# Patient Record
Sex: Male | Born: 1990 | Race: White | Hispanic: No | Marital: Single | Smoking: Former smoker
Health system: Southern US, Community
[De-identification: ages and names within clinical notes are randomized; demographics above are authoritative.]

## PROBLEM LIST (undated history)

## (undated) DIAGNOSIS — J1282 Pneumonia due to coronavirus disease 2019: Secondary | ICD-10-CM

## (undated) DIAGNOSIS — I499 Cardiac arrhythmia, unspecified: Secondary | ICD-10-CM

## (undated) DIAGNOSIS — U071 COVID-19: Secondary | ICD-10-CM

## (undated) DIAGNOSIS — F32A Depression, unspecified: Secondary | ICD-10-CM

## (undated) DIAGNOSIS — F419 Anxiety disorder, unspecified: Secondary | ICD-10-CM

## (undated) HISTORY — PX: KNEE SURGERY: SHX244

---

## 2020-04-10 ENCOUNTER — Other Ambulatory Visit: Payer: Self-pay

## 2020-04-10 ENCOUNTER — Ambulatory Visit (INDEPENDENT_AMBULATORY_CARE_PROVIDER_SITE_OTHER): Payer: No Payment, Other | Admitting: Clinical

## 2020-04-10 DIAGNOSIS — F331 Major depressive disorder, recurrent, moderate: Secondary | ICD-10-CM | POA: Diagnosis not present
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
## 2020-05-03 ENCOUNTER — Encounter (HOSPITAL_COMMUNITY): Payer: Self-pay | Admitting: Psychiatry

## 2020-05-03 ENCOUNTER — Telehealth (INDEPENDENT_AMBULATORY_CARE_PROVIDER_SITE_OTHER): Payer: No Payment, Other | Admitting: Psychiatry

## 2020-05-03 ENCOUNTER — Other Ambulatory Visit: Payer: Self-pay

## 2020-05-03 DIAGNOSIS — F323 Major depressive disorder, single episode, severe with psychotic features: Secondary | ICD-10-CM | POA: Diagnosis not present

## 2020-05-03 DIAGNOSIS — F411 Generalized anxiety disorder: Secondary | ICD-10-CM | POA: Diagnosis not present

## 2020-05-03 DIAGNOSIS — F331 Major depressive disorder, recurrent, moderate: Secondary | ICD-10-CM | POA: Insufficient documentation

## 2020-05-03 MED ORDER — TRAZODONE HCL 50 MG PO TABS: 50.0000 mg | ORAL_TABLET | Freq: Every evening | ORAL | 2 refills | Status: DC | PRN

## 2020-05-03 MED ORDER — DULOXETINE HCL 20 MG PO CPEP: 20.0000 mg | ORAL_CAPSULE | Freq: Every day | ORAL | 2 refills | Status: DC
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
## 2020-05-20 ENCOUNTER — Telehealth (HOSPITAL_COMMUNITY): Payer: Self-pay | Admitting: *Deleted

## 2020-05-20 NOTE — Telephone Encounter (Signed)
Called to express concerns re his new medicine, which is cymbalta and trazodone. States he feels like it is helping him but he has been feeling nauseous all day, and feeling really lethargic and tired. He is taking his medicines with food, and has felt so tired he has called out two days from work. Will share his concerns with Dr Doyne Keel for directions going forward.

## 2020-05-21 NOTE — Telephone Encounter (Signed)
Patient notes that his anxiety and depression has greatly improved since starting Cymbalta and trazodone.  He however notes that he has been feeling more sluggish around 12 noon.  Provider recommended that patient take 25 mg of trazodone instead of 50 mg.  Provider also informed patient that if he does not need the medication he does not have to take it nightly.  He endorsed understanding and agreed.  Provider noted that if patient still feels sluggish after reducing his trazodone dose he can call the clinic to discuss these concerns with provider.  No other concerns noted at this time.

## 2020-05-29 ENCOUNTER — Other Ambulatory Visit: Payer: Self-pay

## 2020-05-29 ENCOUNTER — Telehealth (HOSPITAL_COMMUNITY): Payer: Self-pay | Admitting: Clinical

## 2020-05-29 ENCOUNTER — Ambulatory Visit (INDEPENDENT_AMBULATORY_CARE_PROVIDER_SITE_OTHER): Payer: No Payment, Other | Admitting: Clinical

## 2020-05-29 DIAGNOSIS — F411 Generalized anxiety disorder: Secondary | ICD-10-CM

## 2020-05-30 ENCOUNTER — Emergency Department (HOSPITAL_COMMUNITY)
Admission: EM | Admit: 2020-05-30 | Discharge: 2020-05-30 | Disposition: A | Discharge status: Home / Self Care | Payer: Self-pay | Attending: Emergency Medicine | Admitting: Emergency Medicine

## 2020-05-30 ENCOUNTER — Other Ambulatory Visit: Payer: Self-pay

## 2020-05-30 ENCOUNTER — Emergency Department (HOSPITAL_COMMUNITY): Payer: Self-pay

## 2020-05-30 ENCOUNTER — Encounter (HOSPITAL_COMMUNITY): Payer: Self-pay

## 2020-05-30 DIAGNOSIS — Z20822 Contact with and (suspected) exposure to covid-19: Secondary | ICD-10-CM | POA: Insufficient documentation

## 2020-05-30 DIAGNOSIS — Z87891 Personal history of nicotine dependence: Secondary | ICD-10-CM | POA: Insufficient documentation

## 2020-05-30 DIAGNOSIS — J209 Acute bronchitis, unspecified: Secondary | ICD-10-CM | POA: Insufficient documentation

## 2020-05-30 DIAGNOSIS — Z8616 Personal history of COVID-19: Secondary | ICD-10-CM | POA: Insufficient documentation

## 2020-05-30 HISTORY — DX: Pneumonia due to coronavirus disease 2019: J12.82

## 2020-05-30 HISTORY — DX: Anxiety disorder, unspecified: F41.9

## 2020-05-30 HISTORY — DX: Cardiac arrhythmia, unspecified: I49.9

## 2020-05-30 HISTORY — DX: Depression, unspecified: F32.A

## 2020-05-30 HISTORY — DX: COVID-19: U07.1

## 2020-05-30 MED ORDER — AZITHROMYCIN 250 MG PO TABS: 250.0000 mg | ORAL_TABLET | Freq: Every day | ORAL | 0 refills | Status: DC

## 2020-05-30 MED ORDER — BENZONATATE 100 MG PO CAPS: 100.0000 mg | ORAL_CAPSULE | Freq: Three times a day (TID) | ORAL | 0 refills | Status: DC | PRN

## 2020-05-30 NOTE — ED Triage Notes (Signed)
Patient c/o nasal congestion and cough 1 week .

## 2020-05-30 NOTE — ED Provider Notes (Signed)
[facility]-EMERGENCY DEPT Provider Note   CSN: [id number] Arrival date & time: 05/30/20  1731     History Chief Complaint  Patient presents with  . Cough  . Nasal Congestion    Andrew Turner is a 30 y.o. male.  He is here with a complaint of cough and nasal congestion for 1 week.  He recently traveled to [place].  [place] he is felt feverish but has not checked a temperature.  He had to leave work early today.  He is Covid vaccinated.  Non-smoker.  The history is provided by the patient.  Cough Cough characteristics:  Non-productive Sputum characteristics:  Nondescript Severity:  Moderate Onset quality:  Gradual Timing:  Intermittent Progression:  Unchanged Chronicity:  New Smoker: no   Relieved by:  Nothing Worsened by:  Nothing Ineffective treatments:  None tried Associated symptoms: fever and rhinorrhea   Associated symptoms: no chest pain, no ear pain, no headaches, no myalgias, no rash, no shortness of breath, no sore throat and no wheezing        Past Medical History:  Diagnosis Date  . Anxiety   . Depression   . Irregular heart rhythm   . Pneumonia due to COVID-19 virus     Patient Active Problem List   Diagnosis Date Noted  . Moderate episode of recurrent major depressive disorder (HCC) 05/03/2020  . Severe major depression with psychotic features (HCC) 05/03/2020  . Generalized anxiety disorder 05/03/2020    Past Surgical History:  Procedure Laterality Date  . KNEE SURGERY Left        Family History  Problem Relation Age of Onset  . Atrial fibrillation Mother     Social History   Tobacco Use  . Smoking status: Former Games developer  . Smokeless tobacco: Never Used  Vaping Use  . Vaping Use: Never used  Substance Use Topics  . Alcohol use: Not Currently  . Drug use: Not Currently    Home Medications Prior to Admission medications   Medication Sig Start Date End Date Taking? Authorizing Provider  DULoxetine (CYMBALTA) 20 MG  capsule Take 1 capsule (20 mg total) by mouth daily. 05/03/20   Shanna Cisco, NP  traZODone (DESYREL) 50 MG tablet Take 1 tablet (50 mg total) by mouth at bedtime as needed for sleep. 05/03/20   Shanna Cisco, NP    Allergies    Patient has no known allergies.  Review of Systems   Review of Systems  Constitutional: Positive for fever.  HENT: Positive for rhinorrhea. Negative for ear pain and sore throat.   Eyes: Negative for visual disturbance.  Respiratory: Positive for cough. Negative for shortness of breath and wheezing.   Cardiovascular: Negative for chest pain.  Gastrointestinal: Negative for abdominal pain.  Genitourinary: Negative for dysuria.  Musculoskeletal: Negative for myalgias.  Skin: Negative for rash.  Neurological: Negative for headaches.    Physical Exam Updated Vital Signs BP (!) 144/91 (BP Location: Right Arm)   Pulse 92   Temp 98.3 F (36.8 C) (Oral)   Resp 18   Ht 5\' 8"  (1.727 m)   Wt 80.2 kg   SpO2 98%   BMI 26.88 kg/m   Physical Exam Vitals and nursing note reviewed.  Constitutional:      Appearance: Normal appearance. He is well-developed.  HENT:     Head: Normocephalic and atraumatic.     Right Ear: Tympanic membrane normal.     Left Ear: Tympanic membrane normal.     Mouth/Throat:  Mouth: Mucous membranes are moist.     Pharynx: Oropharynx is clear. No oropharyngeal exudate.  Eyes:     Conjunctiva/sclera: Conjunctivae normal.  Cardiovascular:     Rate and Rhythm: Normal rate and regular rhythm.     Heart sounds: No murmur heard.   Pulmonary:     Effort: Pulmonary effort is normal. No respiratory distress.     Breath sounds: Normal breath sounds.  Abdominal:     Palpations: Abdomen is soft.     Tenderness: There is no abdominal tenderness.  Musculoskeletal:        General: Normal range of motion.     Cervical back: Neck supple.     Right lower leg: No edema.     Left lower leg: No edema.  Skin:    General: Skin is  warm and dry.     Capillary Refill: Capillary refill takes less than 2 seconds.  Neurological:     General: No focal deficit present.     Mental Status: He is alert.     ED Results / Procedures / Treatments   Labs (all labs ordered are listed, but only abnormal results are displayed) Labs Reviewed  SARS CORONAVIRUS 2 (TAT 6-24 HRS)    EKG None  Radiology DG Chest Port 1 View  Result Date: 05/30/2020 CLINICAL DATA:  Cough and congestion EXAM: PORTABLE CHEST 1 VIEW COMPARISON:  None. FINDINGS: The heart size and mediastinal contours are within normal limits. Both lungs are clear. The visualized skeletal structures are unremarkable. IMPRESSION: No active disease. Electronically Signed   By: Jasmine Pang M.D.   On: 05/30/2020 19:13    Procedures Procedures   Medications Ordered in ED Medications - No data to display  ED Course  I have reviewed the triage vital signs and the nursing notes.  Pertinent labs & imaging results that were available during my care of the patient were reviewed by me and considered in my medical decision making (see chart for details).    MDM Rules/Calculators/A&P                         Andrew Turner was evaluated in Emergency Department on 05/31/2020 for the symptoms described in the history of present illness. He was evaluated in the context of the global COVID-19 pandemic, which necessitated consideration that the patient might be at risk for infection with the SARS-CoV-2 virus that causes COVID-19. Institutional protocols and algorithms that pertain to the evaluation of patients at risk for COVID-19 are in a state of rapid change based on information released by regulatory bodies including the CDC and federal and state organizations. These policies and algorithms were followed during the patient's care in the ED.  Differential diagnosis includes COVID, viral URI, bronchitis, pneumonia.  Chest x-ray ordered interpreted by me as no acute findings.  Covid  test pending at time of discharge.  Will cover with Zithromax for possible bacterial bronchitis.  Counseled to stop smoking.  Also counseled on isolation until results of Covid test are back and he can follow this up in MyChart.  Return instructions discussed  Final Clinical Impression(s) / ED Diagnoses Final diagnoses:  Acute bronchitis, unspecified organism  Person under investigation for COVID-19    Rx / DC Orders ED Discharge Orders         Ordered    azithromycin (ZITHROMAX Z-PAK) 250 MG tablet  Daily        05/30/20 1909    benzonatate (TESSALON)  100 MG capsule  3 times daily PRN        05/30/20 1909           Terrilee Files, MD 05/31/20 1026

## 2020-05-30 NOTE — Discharge Instructions (Signed)
You are seen in the emergency department for nasal congestion and cough.  Your Covid testing was pending at time of discharge.  You had a chest x-ray that did not show any pneumonia.  We are treating with antibiotics and cough medicine for bronchitis.  Please drink plenty of fluids.  Return to the emergency department if any worsening or concerning symptoms.  You can follow-up with the results of your Covid test in MyChart.

## 2020-05-31 LAB — SARS CORONAVIRUS 2 (TAT 6-24 HRS): SARS Coronavirus 2: NEGATIVE

## 2020-06-01 NOTE — Progress Notes (Signed)
   THERAPIST PROGRESS NOTE Virtual Visit via Telephone Note  I connected with Andrew Turner on 05/29/20 at  1:00 PM EDT by telephone and verified that I am speaking with the correct person using two identifiers.  Location: Patient: home Provider: office   I discussed the limitations, risks, security and privacy concerns of performing an evaluation and management service by telephone and the availability of in person appointments. I also discussed with the patient that there may be a patient responsible charge related to this service. The patient expressed understanding and agreed to proceed.   Follow Up Instructions: I discussed the assessment and treatment plan with the patient. The patient was provided an opportunity to ask questions and all were answered. The patient agreed with the plan and demonstrated an understanding of the instructions.   The patient was advised to call back or seek an in-person evaluation if the symptoms worsen or if the condition fails to improve as anticipated.   Session Time: 35 minutes  Participation Level: Active  Behavioral Response: CasualAlertEuthymic  Type of Therapy: Individual Therapy  Treatment Goals addressed: Anxiety  Interventions: CBT  Summary:  Andrew Turner is a 30 y.o. male who presents for the scheduled session oriented times five, appropriately dressed, and friendly. Client denied hallucinations and delusions. Client reported on today he has been doing fairly well. Client reported since the last session his anxiety has been making improvement. Client reported he has noticed improved motivation to do his work and hobbies. Client reported he is more aware and working on staying focused on one task at a time. 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 Client reported otherwise he has noticed some changes to his appetite eating a little less than normal but no report of significant weight  changes. Client reported he has also noticed every other morning his hands are shaky before he takes his medication. Client reported he has also noticed that if he doe snot have caffeine he gets headaches.    Suicidal/Homicidal: Nowithout intent/plan  Therapist Response:  Therapist began the session asking how he has been doing since last seen. Therapist actively listened to the clients thoughts and feelings. Therapist used CBT to discuss using go to phrases during work to help manage tasks during work when working with other people. Therapist used CBT to discuss mindful breathing exercises to help alleviate anxiety for homework also. Therapist informed psychiatrist of the clients concern with medication. Client was scheduled for next appointment.    Plan: Return again in 4 weeks for individual therapy.  Diagnosis: Generalized anxiety disorder  Andrew Rhymes Jah Alarid, LCSW 05/29/20

## 2020-06-18 ENCOUNTER — Ambulatory Visit (INDEPENDENT_AMBULATORY_CARE_PROVIDER_SITE_OTHER): Payer: No Payment, Other | Admitting: Clinical

## 2020-06-18 DIAGNOSIS — F411 Generalized anxiety disorder: Secondary | ICD-10-CM

## 2020-06-18 NOTE — Progress Notes (Signed)
   THERAPIST PROGRESS NOTE Virtual Visit via Video Note  I connected with Andrew Turner on 06/18/20 at  1:00 PM EDT by a video enabled telemedicine application and verified that I am speaking with the correct person using two identifiers.  Location: Patient: home Provider: office   I discussed the limitations of evaluation and management by telemedicine and the availability of in person appointments. The patient expressed understanding and agreed to proceed.   Follow Up Instructions: I discussed the assessment and treatment plan with the patient. The patient was provided an opportunity to ask questions and all were answered. The patient agreed with the plan and demonstrated an understanding of the instructions.   The patient was advised to call back or seek an in-person evaluation if the symptoms worsen or if the condition fails to improve as anticipated.   Session Time: 40 minutes  Participation Level: Active  Behavioral Response: CasualAlertEuthymic  Type of Therapy: Individual Therapy  Treatment Goals addressed: Anxiety  Interventions: CBT  Summary:  Andrew Turner is a 30 y.o. male who presents for the scheduled session oriented times five, appropriately dressed, and friendly. Client denied hallucinations and delusions. Client reported on today he is feeling better. Client reported since the last session he found out he had bronchitis and is now recovered from it. Client reported he did encounter a challenge at work with one of his managers. 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 Client reported he was able to have the issue resolved pretty quickly. Client reported he can tell an improvement with how he handled the situation because his anger did not escalate the situation. Client reported he has noticed that his sleep is becoming erratic an his appetite is decreasing. Client reported he is not sure what has caused  either one to occur. 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 Client reported he is nervous about it because of his previous knee surgery. Client reported otherwise he is doing well with medication and able to better manage his response to stressors and communicate how he feels in a constructive way.    Suicidal/Homicidal: Nowithout intent/plan  Therapist Response:  Therapist began the session asking how he has been since the last session. Therapist used active listening and positive emotional support while he discussed his thoughts and feelings. Therapist used CBT and engaged with the client to discuss engaging in positive self talk and communication skills to address his needs. Client was scheduled for next appointment.     Plan: Return again in 5 weeks.  Diagnosis: Generalized anxiety disorder   Neena Rhymes Henreitta Spittler, LCSW 06/18/2020

## 2020-07-06 ENCOUNTER — Emergency Department (HOSPITAL_COMMUNITY): Payer: Self-pay

## 2020-07-06 ENCOUNTER — Other Ambulatory Visit: Payer: Self-pay

## 2020-07-06 ENCOUNTER — Emergency Department (HOSPITAL_COMMUNITY)
Admission: EM | Admit: 2020-07-06 | Discharge: 2020-07-06 | Disposition: A | Discharge status: Home / Self Care | Payer: Self-pay | Attending: Emergency Medicine | Admitting: Emergency Medicine

## 2020-07-06 ENCOUNTER — Encounter (HOSPITAL_COMMUNITY): Payer: Self-pay | Admitting: Emergency Medicine

## 2020-07-06 DIAGNOSIS — M25562 Pain in left knee: Secondary | ICD-10-CM | POA: Insufficient documentation

## 2020-07-06 DIAGNOSIS — X501XXA Overexertion from prolonged static or awkward postures, initial encounter: Secondary | ICD-10-CM | POA: Insufficient documentation

## 2020-07-06 DIAGNOSIS — Z8616 Personal history of COVID-19: Secondary | ICD-10-CM | POA: Insufficient documentation

## 2020-07-06 DIAGNOSIS — Z87891 Personal history of nicotine dependence: Secondary | ICD-10-CM | POA: Insufficient documentation

## 2020-07-06 DIAGNOSIS — Y9372 Activity, wrestling: Secondary | ICD-10-CM | POA: Insufficient documentation

## 2020-07-06 NOTE — ED Notes (Signed)
Knee immbolizer placed on patient before discharge.

## 2020-07-06 NOTE — Discharge Instructions (Signed)
Recommend to take it easy for the next 5-7 days.  Then can slowly resume normal activity. Can take anti-inflammatories (motrin, aleve, etc). If not improving, recommend follow-up with Dr. Susa Simmonds.  Can call his office to arrange an appointment.

## 2020-07-06 NOTE — ED Provider Notes (Signed)
[facility]-EMERGENCY DEPT Provider Note   CSN: [id number] Arrival date & time: 07/06/20  2143     History Chief Complaint  Patient presents with  . Knee Pain    Andrew Turner is a 30 y.o. male.  The history is provided by the patient and medical records.    30 y.o. M with history of anxiety, depression, presenting to the ED with left knee injury.  He reports he is currently undergoing "pro-wrestling" training and strained his knee on Thursday while doing a knee jab.  He states his knee "gave out" afterwards.  He was ok Friday and most of the day today but then started having pain again.  He has been ambulatory.  He reports prior left knee surgery years ago in [place].  He states he just wanted to make sure he was ok resuming his training.  Past Medical History:  Diagnosis Date  . Anxiety   . Depression   . Irregular heart rhythm   . Pneumonia due to COVID-19 virus     Patient Active Problem List   Diagnosis Date Noted  . Moderate episode of recurrent major depressive disorder (HCC) 05/03/2020  . Severe major depression with psychotic features (HCC) 05/03/2020  . Generalized anxiety disorder 05/03/2020    Past Surgical History:  Procedure Laterality Date  . KNEE SURGERY Left        Family History  Problem Relation Age of Onset  . Atrial fibrillation Mother     Social History   Tobacco Use  . Smoking status: Former Games developer  . Smokeless tobacco: Never Used  Vaping Use  . Vaping Use: Never used  Substance Use Topics  . Alcohol use: Not Currently  . Drug use: Not Currently    Home Medications Prior to Admission medications   Medication Sig Start Date End Date Taking? Authorizing Provider  DULoxetine (CYMBALTA) 20 MG capsule Take 1 capsule (20 mg total) by mouth daily. 05/03/20  Yes Toy Cookey E, NP  traZODone (DESYREL) 50 MG tablet Take 1 tablet (50 mg total) by mouth at bedtime as needed for sleep. 05/03/20  Yes Toy Cookey E, NP   azithromycin (ZITHROMAX Z-PAK) 250 MG tablet Take 1 tablet (250 mg total) by mouth daily. Take 2 tablets first day Patient not taking: Reported on 07/06/2020 05/30/20   Terrilee Files, MD  benzonatate (TESSALON) 100 MG capsule Take 1 capsule (100 mg total) by mouth 3 (three) times daily as needed for cough. Patient not taking: Reported on 07/06/2020 05/30/20   Terrilee Files, MD    Allergies    Patient has no known allergies.  Review of Systems   Review of Systems  Musculoskeletal: Positive for arthralgias.  All other systems reviewed and are negative.   Physical Exam Updated Vital Signs BP (!) 144/94 (BP Location: Right Arm)   Pulse 86   Temp 98.3 F (36.8 C) (Oral)   Resp 18   Ht 5\' 8"  (1.727 m)   Wt 78 kg   SpO2 98%   BMI 26.15 kg/m   Physical Exam Vitals and nursing note reviewed.  Constitutional:      Appearance: He is well-developed.  HENT:     Head: Normocephalic and atraumatic.  Eyes:     Conjunctiva/sclera: Conjunctivae normal.     Pupils: Pupils are equal, round, and reactive to light.  Cardiovascular:     Rate and Rhythm: Normal rate and regular rhythm.     Heart sounds: Normal heart sounds.  Pulmonary:  Effort: Pulmonary effort is normal.     Breath sounds: Normal breath sounds.  Abdominal:     General: Bowel sounds are normal.     Palpations: Abdomen is soft.  Musculoskeletal:        General: Normal range of motion.     Cervical back: Normal range of motion.       Legs:     Comments: Left knee grossly normal in appearance, there is some mild tenderness along lateral aspect of patellar tendon but this remains intact, is able to flex/extend without elicited pain  Skin:    General: Skin is warm and dry.  Neurological:     Mental Status: He is alert and oriented to person, place, and time.     ED Results / Procedures / Treatments   Labs (all labs ordered are listed, but only abnormal results are displayed) Labs Reviewed - No data to  display  EKG None  Radiology DG Knee Complete 4 Views Left  Result Date: 07/06/2020 CLINICAL DATA:  Knee pain following training EXAM: LEFT KNEE - COMPLETE 4+ VIEW COMPARISON:  None. FINDINGS: Postsurgical changes are noted. Mild tricompartmental degenerative changes are seen. No joint effusion is seen. No soft tissue abnormality is noted. No acute fracture is seen. IMPRESSION: Chronic changes without acute abnormality. Electronically Signed   By: Alcide Clever M.D.   On: 07/06/2020 22:25    Procedures Procedures   Medications Ordered in ED Medications - No data to display  ED Course  I have reviewed the triage vital signs and the nursing notes.  Pertinent labs & imaging results that were available during my care of the patient were reviewed by me and considered in my medical decision making (see chart for details).    MDM Rules/Calculators/A&P  30 y.o. M here with left knee pain.  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  He remains ambulatory without difficulty.  Has had prior left knee surgery in the past.  On exam, he has some mild tenderness just lateral to the patellar tendon but this remains intact.  He is able to flex/extend without issue.  X-ray is negative.  Suspect this is more of a strain type injury from overuse as he recently started training 3x a week for several hours at a time.  Will place in knee sleeve, recommended supportive care for now.  Close follow-up with orthopedics given if not improving.  Return here for any new/acute changes.  Final Clinical Impression(s) / ED Diagnoses Final diagnoses:  Acute pain of left knee    Rx / DC Orders ED Discharge Orders    None       Garlon Hatchet, PA-C 07/06/20 2256    Cheryll Cockayne, MD 07/06/20 2303

## 2020-07-06 NOTE — ED Triage Notes (Signed)
Patient arrives complaining of left knee pain after his knee buckled during a training session. Patient ambulatory with steady gait. No swelling noted.

## 2020-07-30 ENCOUNTER — Other Ambulatory Visit (HOSPITAL_COMMUNITY): Payer: Self-pay | Admitting: Psychiatry

## 2020-07-30 ENCOUNTER — Other Ambulatory Visit: Payer: Self-pay

## 2020-07-30 ENCOUNTER — Encounter (HOSPITAL_COMMUNITY): Payer: Self-pay | Admitting: Psychiatry

## 2020-07-30 ENCOUNTER — Telehealth (INDEPENDENT_AMBULATORY_CARE_PROVIDER_SITE_OTHER): Payer: No Payment, Other | Admitting: Psychiatry

## 2020-07-30 DIAGNOSIS — F411 Generalized anxiety disorder: Secondary | ICD-10-CM | POA: Diagnosis not present

## 2020-07-30 DIAGNOSIS — F323 Major depressive disorder, single episode, severe with psychotic features: Secondary | ICD-10-CM | POA: Diagnosis not present

## 2020-07-30 MED ORDER — TRAZODONE HCL 50 MG PO TABS: 50.0000 mg | ORAL_TABLET | Freq: Every evening | ORAL | 2 refills | Status: DC | PRN

## 2020-07-30 MED ORDER — DULOXETINE HCL 20 MG PO CPEP: ORAL_CAPSULE | ORAL | 2 refills | Status: DC

## 2020-07-30 NOTE — Progress Notes (Signed)
BH MD/PA/NP OP Progress Note Virtual Visit via Video Note  I connected with Andrew Turner on 07/30/20 at  2:00 PM EDT by a video enabled telemedicine application and verified that I am speaking with the correct person using two identifiers.  Location: Patient: Home Provider: Clinic   I discussed the limitations of evaluation and management by telemedicine and the availability of in person appointments. The patient expressed understanding and agreed to proceed.  I provided of non-face-to-face time during this encounter.    07/30/2020 2:16 PM Andrew Turner  MRN:  [id number]  Chief Complaint: " I was nauseous so I stopped the trazodone but I am going to restart it"  HPI: 30 year old male seen today for follow up psychiatric evaluation. He has a psychiatric history of poly substance use (marjuina, alcohol, molly, acid, prescription pills), anxiety and depression. Currently he is managed on Celexa 20 mg daily and trazodone 25 mg - 50 mg nightly as needed.  He notes his medications are effective in managing his psychiatric conditions.  Today he is pleasant, cooperative, and engaged in conversation. He informed provider that since his last visit his anxiety and depression has improved.  Provider conducted a GAD-7 and patient scored a 3, at his last visit he scored a 18.  Provider also conducted a PHQ-9 and patient scored an 8, at his last visit he scored a 25.  Patient notes that he discontinued trazodone because he was nauseous.  He informed Clinical research associate however that since discontinuing trazodone he has not been able to sleep.  He informed Clinical research associate that he plans to restart it because he will be working more at [employer] and wants to be rested.  Today he denies SI/HI/VAH, mania, or paranoia.    Patient notes that he continues to maintain his sobriety.  He informed provider that he was at wrestling practice recently and hurt his left knee.  He describes his pain as 7 out of 10.  Patient notes that  he has been taking ibuprofen and Advil to help manage his pain.  No medication changes made today.  Patient agreeable to continue medication scribed.  He will follow-up with outpatient counseling for therapy.  No other concerns noted at this time.   Visit Diagnosis:    ICD-10-CM   1. Severe major depression with psychotic features (HCC)  F32.3 traZODone (DESYREL) 50 MG tablet    DULoxetine (CYMBALTA) 20 MG capsule  2. Generalized anxiety disorder  F41.1 DULoxetine (CYMBALTA) 20 MG capsule    Past Psychiatric History: poly substance use (marjuina, alcohol, molly, acid, prescription pills), anxiety and depression  Past Medical History:  Past Medical History:  Diagnosis Date  . Anxiety   . Depression   . Irregular heart rhythm   . Pneumonia due to COVID-19 virus     Past Surgical History:  Procedure Laterality Date  . KNEE SURGERY Left     Family Psychiatric History:  Father alcohol use  Family History:  Family History  Problem Relation Age of Onset  . Atrial fibrillation Mother     Social History:  Social History   Socioeconomic History  . Marital status: Single    Spouse name: Not on file  . Number of children: Not on file  . Years of education: Not on file  . Highest education level: Not on file  Occupational History  . Not on file  Tobacco Use  . Smoking status: Former Games developer  . Smokeless tobacco: Never Used  Vaping Use  .  Vaping Use: Never used  Substance and Sexual Activity  . Alcohol use: Not Currently  . Drug use: Not Currently  . Sexual activity: Not on file  Other Topics Concern  . Not on file  Social History Narrative  . Not on file   Social Determinants of Health   Financial Resource Strain: Not on file  Food Insecurity: Not on file  Transportation Needs: Not on file  Physical Activity: Not on file  Stress: Not on file  Social Connections: Not on file    Allergies: No Known Allergies  Metabolic Disorder Labs: No results found for:  HGBA1C, MPG No results found for: PROLACTIN No results found for: CHOL, TRIG, HDL, CHOLHDL, VLDL, LDLCALC No results found for: TSH  Therapeutic Level Labs: No results found for: LITHIUM No results found for: VALPROATE No components found for:  CBMZ  Current Medications: Current Outpatient Medications  Medication Sig Dispense Refill  . azithromycin (ZITHROMAX Z-PAK) 250 MG tablet Take 1 tablet (250 mg total) by mouth daily. Take 2 tablets first day (Patient not taking: Reported on 07/06/2020) 6 tablet 0  . benzonatate (TESSALON) 100 MG capsule Take 1 capsule (100 mg total) by mouth 3 (three) times daily as needed for cough. (Patient not taking: Reported on 07/06/2020) 21 capsule 0  . DULoxetine (CYMBALTA) 20 MG capsule TAKE 1 CAPSULE BY MOUTH EVERY DAY 30 capsule 2  . traZODone (DESYREL) 50 MG tablet Take 1 tablet (50 mg total) by mouth at bedtime as needed. for sleep 30 tablet 2   No current facility-administered medications for this visit.     Musculoskeletal: Strength & Muscle Tone: Unable to assess due to telehealth visit Gait & Station: Unable to assess due to telehealth visit Patient leans: N/A  Psychiatric Specialty Exam: Review of Systems  There were no vitals taken for this visit.There is no height or weight on file to calculate BMI.  General Appearance: Well Groomed  Eye Contact:  Good  Speech:  Clear and Coherent and Normal Rate  Volume:  Normal  Mood:  Euthymic  Affect:  Appropriate and Congruent  Thought Process:  Coherent, Goal Directed and Linear  Orientation:  Full (Time, Place, and Person)  Thought Content: WDL and Logical   Suicidal Thoughts:  No  Homicidal Thoughts:  No  Memory:  Immediate;   Good Recent;   Good Remote;   Good  Judgement:  Good  Insight:  Good  Psychomotor Activity:  Normal  Concentration:  Concentration: Good and Attention Span: Good  Recall:  Good  Fund of Knowledge: Good  Language: Good  Akathisia:  No  Handed:  Right  AIMS (if  indicated): Not done  Assets:  Communication Skills Desire for Improvement Financial Resources/Insurance Housing Leisure Time Physical Health Social Support  ADL's:  Intact  Cognition: WNL  Sleep:  Fair   Screenings: GAD-7   Flowsheet Row Video Visit from 07/30/2020 in [facility] Video Visit from 05/03/2020 in [facility] Counselor from 04/10/2020 in [facility]  Total GAD-7 Score 3 18 15     PHQ2-9   Flowsheet Row Video Visit from 07/30/2020 in [facility] Video Visit from 05/03/2020 in [facility] Counselor from 04/10/2020 in [facility]  PHQ-2 Total Score 1 4 4   PHQ-9 Total Score 8 25 18     Flowsheet Row ED from 07/06/2020 in [facility] ED from 05/30/2020 in [facility] Video Visit from 05/03/2020 in [facility]  C-SSRS RISK CATEGORY No Risk No Risk Error: Q7 should not be populated when Q6 is No       Assessment and Plan: Patient notes that his anxiety and depression has improved since his last visit.  He however notes that his sleep has worsened due to discontinuing trazodone because it made him nauseous.  Today he reports that he wants to restart trazodone.  He will continue all other medication as prescribed.  1. Severe major depression with psychotic features (HCC)  Restart- traZODone (DESYREL) 50 MG tablet; Take 1 tablet (50 mg total) by mouth at bedtime as needed. for sleep  Dispense: 30 tablet; Refill: 2 Continue- DULoxetine (CYMBALTA) 20 MG capsule; TAKE 1 CAPSULE BY MOUTH EVERY DAY  Dispense: 30 capsule; Refill: 2  2. Generalized anxiety disorder * -Continue DULoxetine (CYMBALTA) 20 MG capsule; TAKE 1 CAPSULE BY MOUTH EVERY DAY  Dispense: 30 capsule; Refill: 2  Follow-up in 3 months Follow-up with  therapy  Shanna Cisco, NP 07/30/2020, 2:16 PM

## 2020-08-06 ENCOUNTER — Ambulatory Visit (INDEPENDENT_AMBULATORY_CARE_PROVIDER_SITE_OTHER): Payer: No Payment, Other | Admitting: Clinical

## 2020-08-06 ENCOUNTER — Other Ambulatory Visit: Payer: Self-pay

## 2020-08-06 DIAGNOSIS — F411 Generalized anxiety disorder: Secondary | ICD-10-CM

## 2020-08-06 NOTE — Progress Notes (Signed)
   THERAPIST PROGRESS NOTE Virtual Visit via Video Note  I connected with [name] on 08/06/20 at  1:00 PM EDT by a video enabled telemedicine application and verified that I am speaking with the correct person using two identifiers.  Location: Patient: home Provider: office   I discussed the limitations of evaluation and management by telemedicine and the availability of in person appointments. The patient expressed understanding and agreed to proceed.   Follow Up Instructions: I discussed the assessment and treatment plan with the patient. The patient was provided an opportunity to ask questions and all were answered. The patient agreed with the plan and demonstrated an understanding of the instructions.   The patient was advised to call back or seek an in-person evaluation if the symptoms worsen or if the condition fails to improve as anticipated.   Session Time: 30 minutes  Participation Level: Active  Behavioral Response: CasualAlertEuthymic  Type of Therapy: Individual Therapy  Treatment Goals addressed: Anxiety  Interventions: CBT and Supportive  Summary:  Andrew Turner is a 30 y.o. male who presents for the scheduled session oriented times five, appropriately dressed, and friendly. Client denied hallucinations and delusions.  Client reported on today he is doing well. Client reported since the last session he quit his job due to conflict but was able to go back to work a few weeks ago. Client reported he pursued wrestling but he reinjured his knee. Client reported he went to his doctor and is now saving money to be able to see a orthopedic surgeon. Client reported his knee hurts daily now. Client reported otherwise he is doing well with his medications but his sleep schedule has been inconsistent. Client reported that is in part due to not having his medication for a few weeks due to finances. Client reported he sleeps more during the day than at night. Client reported even  while taking trazadone it does not take affect for him until three hours later. Client reported otherwise his anxiety is improved and not experiencing panic attacks.    Suicidal/Homicidal: Nowithout intent/plan  Therapist Response:  Therapist began the session checking in asking how he has been doing since last seen. Therapist active listened and used positive emotional support while he describe his thoughts and feelings. Therapist used CBT to ask open ended questions and discuss the progress of his anxiety symptoms and his noticeable identifier of progress. Therapist assigned the client home work to practice self care. Therapist completed the pain assessment. Client was scheduled for next appointment.      Plan: Return again in 4 weeks.  Diagnosis: Generalized anxiety disorder  Neena Rhymes Janiel Crisostomo, LCSW 08/06/2020

## 2020-09-03 ENCOUNTER — Ambulatory Visit (INDEPENDENT_AMBULATORY_CARE_PROVIDER_SITE_OTHER): Payer: No Payment, Other | Admitting: Clinical

## 2020-09-03 ENCOUNTER — Other Ambulatory Visit: Payer: Self-pay

## 2020-09-03 DIAGNOSIS — F411 Generalized anxiety disorder: Secondary | ICD-10-CM

## 2020-09-03 NOTE — Progress Notes (Signed)
   THERAPIST PROGRESS NOTE Virtual Visit via Video Note  I connected with Andrew Turner on 09/03/20 at  1:00 PM EDT by a video enabled telemedicine application and verified that I am speaking with the correct person using two identifiers.  Location: Patient: home Provider: office   I discussed the limitations of evaluation and management by telemedicine and the availability of in person appointments. The patient expressed understanding and agreed to proceed.   Follow Up Instructions: I discussed the assessment and treatment plan with the patient. The patient was provided an opportunity to ask questions and all were answered. The patient agreed with the plan and demonstrated an understanding of the instructions.   The patient was advised to call back or seek an in-person evaluation if the symptoms worsen or if the condition fails to improve as anticipated.   Session Time: 25 minutes  Participation Level: Active  Behavioral Response: CasualAlertEuthymic  Type of Therapy: Individual Therapy  Treatment Goals addressed: Coping  Interventions: CBT and Supportive  Summary:  Andrew Turner is a 30 y.o. male who presents oriented x5, appropriately dressed, and friendly.  Client denies hallucinations and delusions.  Client reported on today he is doing well.  Client reported his job with [employer] which has been part-time has not been working him as much.  Client reported he is working 2 days a week and has not been making enough money to cover his rent.  Client reported he is now looking for another job.  Client reported he has been doing well with his sleep time medications stating that he has been sleeping a lot better.  Client reported he does however forget to take his medications some days.  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  Client reported in the past week having 1 panic attack.  Client reported that has been the first panic attack since he started his psychotropic medications.  Client reported the panic attack and recent stress has been attributed to financial strain.  Client reported his landlord has been very accommodating as he makes payments towards his rent.  Client reported he has been thinking about having his cat as a certified emotional support animal.  Client reported no other concerns at this time.    Suicidal/Homicidal: Nowithout intent/plan  Therapist Response:  Therapist began the session by asking the client how he has been doing since last seen. Therapist used active listening and positive emotional support towards the client's thoughts and feelings. Therapist used CBT to ask open-ended questions about his medication compliance compared to his lingering symptoms. Therapist used CBT to ask open-ended questions about contributing external factors affecting his anxiety symptoms. Therapist used CBT to normalize the clients emotions to stressors. Therapist assigned the client homework to practice self-care to reduce anxiety symptoms. Client was scheduled for next appointment.    Plan: Return again in 5 weeks.  Diagnosis: Generalized anxiety disorder   Neena Rhymes Demeco Ducksworth, LCSW 09/03/2020

## 2020-10-04 ENCOUNTER — Ambulatory Visit (HOSPITAL_COMMUNITY): Payer: No Payment, Other | Admitting: Clinical

## 2020-10-21 ENCOUNTER — Ambulatory Visit (HOSPITAL_COMMUNITY): Payer: No Payment, Other | Admitting: Clinical

## 2020-10-29 ENCOUNTER — Telehealth (HOSPITAL_COMMUNITY): Payer: No Payment, Other | Admitting: Psychiatry

## 2020-10-29 ENCOUNTER — Other Ambulatory Visit: Payer: Self-pay

## 2020-11-01 ENCOUNTER — Encounter (HOSPITAL_COMMUNITY): Payer: Self-pay | Admitting: Emergency Medicine

## 2020-11-01 ENCOUNTER — Other Ambulatory Visit: Payer: Self-pay

## 2020-11-01 ENCOUNTER — Emergency Department (HOSPITAL_COMMUNITY)
Admission: EM | Admit: 2020-11-01 | Discharge: 2020-11-01 | Disposition: A | Discharge status: Home / Self Care | Payer: Self-pay | Attending: Emergency Medicine | Admitting: Emergency Medicine

## 2020-11-01 DIAGNOSIS — Z8616 Personal history of COVID-19: Secondary | ICD-10-CM | POA: Insufficient documentation

## 2020-11-01 DIAGNOSIS — B349 Viral infection, unspecified: Secondary | ICD-10-CM | POA: Insufficient documentation

## 2020-11-01 DIAGNOSIS — Z20822 Contact with and (suspected) exposure to covid-19: Secondary | ICD-10-CM | POA: Insufficient documentation

## 2020-11-01 DIAGNOSIS — Z87891 Personal history of nicotine dependence: Secondary | ICD-10-CM | POA: Insufficient documentation

## 2020-11-01 LAB — RESP PANEL BY RT-PCR (FLU A&B, COVID) ARPGX2
Influenza A by PCR: NEGATIVE
Influenza B by PCR: NEGATIVE
SARS Coronavirus 2 by RT PCR: NEGATIVE

## 2020-11-01 LAB — GROUP A STREP BY PCR: Group A Strep by PCR: NOT DETECTED

## 2020-11-01 MED ORDER — BENZONATATE 100 MG PO CAPS: 100.0000 mg | ORAL_CAPSULE | Freq: Three times a day (TID) | ORAL | 0 refills | Status: DC | PRN

## 2020-11-01 MED ORDER — ACETAMINOPHEN 500 MG PO TABS: 500.0000 mg | ORAL_TABLET | Freq: Four times a day (QID) | ORAL | 0 refills | Status: DC | PRN

## 2020-11-01 MED ORDER — ACETAMINOPHEN 325 MG PO TABS
650.0000 mg | ORAL_TABLET | Freq: Once | ORAL | Status: DC | PRN
  Filled 2020-11-01: qty 2

## 2020-11-01 NOTE — ED Triage Notes (Signed)
Patient is complaining of throat pain, coughing, and sinus issues. Patient stated it started yesterday.

## 2020-11-01 NOTE — ED Provider Notes (Signed)
[facility]-EMERGENCY DEPT Provider Note   CSN: [id number] Arrival date & time: 11/01/20  1916     History Chief Complaint  Patient presents with   Cough   Sore Throat    Andrew Turner is a 30 y.o. male.  The history is provided by the patient and medical records. No language interpreter was used.  Cough Sore Throat    30 year old male here with cold sxs.  Pt report cough, sore throat and congestion x 1 days.  Also report throbbing headache and feeling flushed yesterday but that has resolved.  Did took tylenol and drink plenty of fluid with minimal relief.  Has had covid vaccine previously . Did travel to to [place] last week.  Has had bronchitis in the past.  Denies chills, fatigue, sob, cp, myalgia.  No eyes sxs but endorse nasal congestion.      Past Medical History:  Diagnosis Date   Anxiety    Depression    Irregular heart rhythm    Pneumonia due to COVID-19 virus     Patient Active Problem List   Diagnosis Date Noted   Moderate episode of recurrent major depressive disorder (HCC) 05/03/2020   Severe major depression with psychotic features (HCC) 05/03/2020   Generalized anxiety disorder 05/03/2020    Past Surgical History:  Procedure Laterality Date   KNEE SURGERY Left        Family History  Problem Relation Age of Onset   Atrial fibrillation Mother     Social History   Tobacco Use   Smoking status: Former   Smokeless tobacco: Never  Vaping Use   Vaping Use: Never used  Substance Use Topics   Alcohol use: Not Currently   Drug use: Not Currently    Home Medications Prior to Admission medications   Medication Sig Start Date End Date Taking? Authorizing Provider  azithromycin (ZITHROMAX Z-PAK) 250 MG tablet Take 1 tablet (250 mg total) by mouth daily. Take 2 tablets first day Patient not taking: Reported on 07/06/2020 05/30/20   Terrilee Files, MD  benzonatate (TESSALON) 100 MG capsule Take 1 capsule (100 mg total) by mouth 3  (three) times daily as needed for cough. Patient not taking: Reported on 07/06/2020 05/30/20   Terrilee Files, MD  DULoxetine (CYMBALTA) 20 MG capsule TAKE 1 CAPSULE BY MOUTH EVERY DAY 07/30/20   Toy Cookey E, NP  traZODone (DESYREL) 50 MG tablet Take 1 tablet (50 mg total) by mouth at bedtime as needed. for sleep 07/30/20   Shanna Cisco, NP    Allergies    Patient has no known allergies.  Review of Systems   Review of Systems  Respiratory:  Positive for cough.   All other systems reviewed and are negative.  Physical Exam Updated Vital Signs BP 119/86 (BP Location: Left Arm)   Pulse 99   Temp 98.3 F (36.8 C) (Oral)   Resp 17   Ht 5\' 8"  (1.727 m)   Wt 80.5 kg   SpO2 98%   BMI 26.97 kg/m   Physical Exam Vitals and nursing note reviewed.  Constitutional:      General: He is not in acute distress.    Appearance: He is well-developed.  HENT:     Head: Atraumatic.     Nose: No congestion.     Mouth/Throat:     Mouth: Mucous membranes are moist.     Pharynx: Oropharynx is clear. Uvula midline. No oropharyngeal exudate, posterior oropharyngeal erythema or uvula  swelling.     Tonsils: No tonsillar exudate or tonsillar abscesses.  Eyes:     Conjunctiva/sclera: Conjunctivae normal.  Cardiovascular:     Rate and Rhythm: Normal rate and regular rhythm.  Pulmonary:     Effort: Pulmonary effort is normal.     Breath sounds: Normal breath sounds. No wheezing, rhonchi or rales.  Abdominal:     Palpations: Abdomen is soft.     Tenderness: There is no abdominal tenderness.  Musculoskeletal:     Cervical back: Neck supple.  Lymphadenopathy:     Cervical: No cervical adenopathy.  Skin:    Findings: No rash.  Neurological:     Mental Status: He is alert and oriented to person, place, and time.  Psychiatric:        Mood and Affect: Mood normal.    ED Results / Procedures / Treatments   Labs (all labs ordered are listed, but only abnormal results are  displayed) Labs Reviewed  GROUP A STREP BY PCR  RESP PANEL BY RT-PCR (FLU A&B, COVID) [id number]    EKG None  Radiology No results found.  Procedures Procedures   Medications Ordered in ED Medications  acetaminophen (TYLENOL) tablet 650 mg (has no administration in time range)    ED Course  I have reviewed the triage vital signs and the nursing notes.  Pertinent labs & imaging results that were available during my care of the patient were reviewed by me and considered in my medical decision making (see chart for details).    MDM Rules/Calculators/A&P                           BP 119/86 (BP Location: Left Arm)   Pulse 99   Temp 98.3 F (36.8 C) (Oral)   Resp 17   Ht 5\' 8"  (1.727 m)   Wt 80.5 kg   SpO2 98%   BMI 26.97 kg/m   Final Clinical Impression(s) / ED Diagnoses Final diagnoses:  Viral illness    Rx / DC Orders ED Discharge Orders     None      11:01 PM Patient here with cold symptoms for 1 day.  Strep test COVID test negative.  Although his symptoms may be early signs of COVID, in the setting of negative test, recommend symptomatic treatment at home by give return precaution work note provided.  Andrew Turner was evaluated in Emergency Department on 11/01/2020 for the symptoms described in the history of present illness. He was evaluated in the context of the global COVID-19 pandemic, which necessitated consideration that the patient might be at risk for infection with the SARS-CoV-2 virus that causes COVID-19. Institutional protocols and algorithms that pertain to the evaluation of patients at risk for COVID-19 are in a state of rapid change based on information released by regulatory bodies including the CDC and federal and state organizations. These policies and algorithms were followed during the patient's care in the ED.    01/01/2021, PA-C 11/01/20 01/01/21    1443, MD 11/02/20 [id number]

## 2020-11-27 NOTE — Progress Notes (Signed)
Erroneous encounter.  Chart opened in error. 

## 2021-07-11 ENCOUNTER — Other Ambulatory Visit: Payer: Self-pay

## 2021-07-11 ENCOUNTER — Emergency Department (HOSPITAL_COMMUNITY)
Admission: EM | Admit: 2021-07-11 | Discharge: 2021-07-11 | Disposition: A | Discharge status: Home / Self Care | Payer: BC Managed Care – PPO | Attending: Emergency Medicine | Admitting: Emergency Medicine

## 2021-07-11 ENCOUNTER — Emergency Department (HOSPITAL_COMMUNITY): Payer: BC Managed Care – PPO

## 2021-07-11 ENCOUNTER — Encounter (HOSPITAL_COMMUNITY): Payer: Self-pay

## 2021-07-11 DIAGNOSIS — M25562 Pain in left knee: Secondary | ICD-10-CM | POA: Diagnosis present

## 2021-07-11 DIAGNOSIS — Y93E1 Activity, personal bathing and showering: Secondary | ICD-10-CM | POA: Diagnosis not present

## 2021-07-11 DIAGNOSIS — W182XXA Fall in (into) shower or empty bathtub, initial encounter: Secondary | ICD-10-CM | POA: Diagnosis not present

## 2021-07-11 MED ORDER — KETOROLAC TROMETHAMINE 15 MG/ML IJ SOLN
15.0000 mg | Freq: Once | INTRAMUSCULAR | Status: AC
  Administered 2021-07-11: 15 mg via INTRAMUSCULAR
  Filled 2021-07-11: qty 1

## 2021-07-11 NOTE — Discharge Instructions (Signed)
I recommend a combination of tylenol and ibuprofen for management of your pain. You can take a low dose of both at the same time. I recommend 500 mg of Tylenol combined with 600 mg of ibuprofen. This is one maximum strength Tylenol and three regular ibuprofen. You can take these 2-3 times for day for your pain. Please try to take these medications with a small amount of food as well to prevent upsetting your stomach. ? ?Also, please consider topical pain relieving creams such as Voltaran Gel, BioFreeze, or Icy Hot. There is also a pain relieving cream made by [employer]. You should be able to find all of these at your local pharmacy.  ? ?Please continue to wear your knee sleeve as needed for comfort. ? ?Below is the contact information for Dr. Susa Simmonds.  Please give their office a call and schedule an appointment for reevaluation of your left knee. ? ?If you develop any new or worsening symptoms please come back to the emergency department. ?

## 2021-07-11 NOTE — ED Provider Notes (Signed)
?[facility] DEPT ?Provider Note ? ? ?CSN: [id number] ?Arrival date & time: 07/11/21  0136 ? ?  ? ?History ? ?Chief Complaint  ?Patient presents with  ? Knee Pain  ? ? ?Andrew Turner is a 31 y.o. male. ? ?HPI ?Patient is a 31 year old male who presents to the emergency department due to left knee pain for the past week.  States that his symptoms worsen with ambulation.  States that he was showering about 2 hours prior to arrival and slipped in the shower and caught himself but did not fall to the ground.  States that when this occurred he began having significantly worsening pain in the left knee.  No numbness.  States that he had multiple surgeries to the left knee previously in [place]. ?  ? ?Home Medications ?Prior to Admission medications   ?Medication Sig Start Date End Date Taking? Authorizing Provider  ?acetaminophen (TYLENOL) 500 MG tablet Take 1 tablet (500 mg total) by mouth every 6 (six) hours as needed. 11/01/20   Domenic Moras, PA-C  ?benzonatate (TESSALON) 100 MG capsule Take 1 capsule (100 mg total) by mouth 3 (three) times daily as needed for cough. 11/01/20   Domenic Moras, PA-C  ?DULoxetine (CYMBALTA) 20 MG capsule TAKE 1 CAPSULE BY MOUTH EVERY DAY 07/30/20   Eulis Canner E, NP  ?traZODone (DESYREL) 50 MG tablet Take 1 tablet (50 mg total) by mouth at bedtime as needed. for sleep 07/30/20   Salley Slaughter, NP  ?   ? ?Allergies    ?Patient has no known allergies.   ? ?Review of Systems   ?Review of Systems  ?Musculoskeletal:  Positive for arthralgias and myalgias.  ?Skin:  Negative for color change and wound.  ?Neurological:  Negative for numbness.  ? ?Physical Exam ?Updated Vital Signs ?BP 133/80 (BP Location: Left Arm)   Pulse 91   Temp 98 ?F (36.7 ?C) (Oral)   Resp 16   Ht 5\' 8"  (1.727 m)   Wt 81.6 kg   SpO2 94%   BMI 27.37 kg/m?  ?Physical Exam ?Vitals and nursing note reviewed.  ?Constitutional:   ?   General: He is not in acute distress. ?   Appearance: He  is well-developed.  ?HENT:  ?   Head: Normocephalic and atraumatic.  ?   Right Ear: External ear normal.  ?   Left Ear: External ear normal.  ?Eyes:  ?   General: No scleral icterus.    ?   Right eye: No discharge.     ?   Left eye: No discharge.  ?   Conjunctiva/sclera: Conjunctivae normal.  ?Neck:  ?   Trachea: No tracheal deviation.  ?Cardiovascular:  ?   Rate and Rhythm: Normal rate.  ?Pulmonary:  ?   Effort: Pulmonary effort is normal. No respiratory distress.  ?   Breath sounds: No stridor.  ?Abdominal:  ?   General: There is no distension.  ?Musculoskeletal:     ?   General: Tenderness present. No swelling or deformity.  ?   Cervical back: Neck supple.  ?   Comments: Mild to moderate TTP noted circumferentially in the left knee.  No soft tissue swelling.  No increased warmth.  No overlying redness.  Full active and passive range of motion of the left knee.  Distal sensation intact.  Palpable pedal pulses.  No laxity appreciated with anterior/posterior drawer or varus/valgus stress, though difficult to assess due to patient's pain.  ?Skin: ?   General:  Skin is warm and dry.  ?   Findings: No rash.  ?Neurological:  ?   Mental Status: He is alert.  ?   Cranial Nerves: Cranial nerve deficit: no gross deficits.  ? ?ED Results / Procedures / Treatments   ?Labs ?(all labs ordered are listed, but only abnormal results are displayed) ?Labs Reviewed - No data to display ? ?EKG ?None ? ?Radiology ?DG Knee Complete 4 Views Left ? ?Result Date: 07/11/2021 ?CLINICAL DATA:  Twisted left knee with knee pain. EXAM: LEFT KNEE - COMPLETE 4+ VIEW COMPARISON:  07/06/2020. FINDINGS: No acute fracture or dislocation. Mild degenerative changes are present in the medial and lateral compartments. Postsurgical changes are present at the lateral femoral condyle. No joint effusion. IMPRESSION: 1. No acute fracture or dislocation. 2. Mild degenerative changes. Electronically Signed   By: Brett Fairy M.D.   On: 07/11/2021 02:37    ? ?Procedures ?Procedures  ? ?Medications Ordered in ED ?Medications  ?ketorolac (TORADOL) 15 MG/ML injection 15 mg (15 mg Intramuscular Given 07/11/21 0223)  ? ? ?ED Course/ Medical Decision Making/ A&P ?  ?                        ?Medical Decision Making ?Amount and/or Complexity of Data Reviewed ?Radiology: ordered. ? ?Risk ?Prescription drug management. ? ? ?Patient is a 31 year old male who presents to the emergency department for evaluation of left knee pain.  States that has been ongoing for the past week but worsened prior to arrival after slipping in the shower. ? ?On my exam patient has circumferential tenderness in the left knee.  No increased warmth of the joint.  No soft tissue swelling.  Full active and passive range of motion of the knee.  Neurovascularly intact distal to the joint.  I obtained an x-ray of the left knee which shows no acute fracture or dislocation.  Mild degenerative changes.  Patient afebrile, nontachycardic, and nontoxic-appearing.  Doubt septic joint at this time. ? ?Patient appears stable for discharge at this time and he is agreeable.  We will place patient in a knee sleeve for comfort.  Patient given a work note.  Recommending continued use of Tylenol as well as Motrin.  We discussed dosing.  RICE method.  He was given a referral to orthopedics.  His questions were answered and he was amicable at the time of discharge. ? ?Final Clinical Impression(s) / ED Diagnoses ?Final diagnoses:  ?Acute pain of left knee  ? ?Rx / DC Orders ?ED Discharge Orders   ? ? None  ? ?  ? ? ?  ?Rayna Sexton, PA-C ?07/11/21 [id number] ? ?  ?Quintella Reichert, MD ?07/11/21 0319 ? ?

## 2021-07-11 NOTE — ED Triage Notes (Signed)
Pt reports with left knee pain x 1 week. Pt states that he has had surgery on that knee twice.  ?

## 2021-07-20 ENCOUNTER — Encounter (HOSPITAL_COMMUNITY): Payer: Self-pay

## 2021-07-20 ENCOUNTER — Emergency Department (HOSPITAL_COMMUNITY)
Admission: EM | Admit: 2021-07-20 | Discharge: 2021-07-21 | Disposition: A | Discharge status: Home / Self Care | Payer: BC Managed Care – PPO | Attending: Emergency Medicine | Admitting: Emergency Medicine

## 2021-07-20 ENCOUNTER — Other Ambulatory Visit: Payer: Self-pay

## 2021-07-20 DIAGNOSIS — M79605 Pain in left leg: Secondary | ICD-10-CM | POA: Insufficient documentation

## 2021-07-20 DIAGNOSIS — W1840XA Slipping, tripping and stumbling without falling, unspecified, initial encounter: Secondary | ICD-10-CM | POA: Insufficient documentation

## 2021-07-20 DIAGNOSIS — M25562 Pain in left knee: Secondary | ICD-10-CM

## 2021-07-20 NOTE — ED Triage Notes (Signed)
Pt presents to ED from home with c/o left knee pain ongoing x 3 weeks. States he had MRI done this week but will not receive the results until Wednesday.

## 2021-07-21 ENCOUNTER — Ambulatory Visit (HOSPITAL_BASED_OUTPATIENT_CLINIC_OR_DEPARTMENT_OTHER)
Admission: RE | Admit: 2021-07-21 | Discharge: 2021-07-21 | Disposition: A | Discharge status: Home / Self Care | Payer: BC Managed Care – PPO | Source: Ambulatory Visit | Attending: Student | Admitting: Student

## 2021-07-21 ENCOUNTER — Ambulatory Visit (HOSPITAL_COMMUNITY): Payer: BC Managed Care – PPO

## 2021-07-21 DIAGNOSIS — M25562 Pain in left knee: Secondary | ICD-10-CM | POA: Diagnosis not present

## 2021-07-21 DIAGNOSIS — M79605 Pain in left leg: Secondary | ICD-10-CM | POA: Insufficient documentation

## 2021-07-21 MED ORDER — OXYCODONE-ACETAMINOPHEN 5-325 MG PO TABS: 1.0000 | ORAL_TABLET | Freq: Three times a day (TID) | ORAL | 0 refills | Status: AC | PRN

## 2021-07-21 MED ORDER — PREDNISONE 10 MG PO TABS: 30.0000 mg | ORAL_TABLET | Freq: Every day | ORAL | 0 refills | Status: AC

## 2021-07-21 MED ORDER — HYDROMORPHONE HCL 1 MG/ML IJ SOLN
1.0000 mg | Freq: Once | INTRAMUSCULAR | Status: AC
  Administered 2021-07-21: 1 mg via INTRAMUSCULAR
  Filled 2021-07-21: qty 1

## 2021-07-21 NOTE — Discharge Instructions (Signed)
Knee pain-likely acute on chronic pain, of starting on steroids please take as prescribed.   also given you pain medications take as prescribed.  Please follow-up with your orthopedic surgeon for further evaluation.  I have given you a short course of narcotics please take as prescribed.  This medication can make you drowsy do not consume alcohol or operate heavy machinery when taking this medication.  This medication is Tylenol in it do not take Tylenol and take this medication.  Come back to the emergency department if you develop chest pain, shortness of breath, severe abdominal pain, uncontrolled nausea, vomiting, diarrhea.

## 2021-07-21 NOTE — ED Provider Notes (Signed)
[name]-EMERGENCY DEPT Provider Note   CSN: [id number] Arrival date & time: 07/20/21  2320     History  Chief Complaint  Patient presents with   Knee Pain    Andrew Turner is a 30 y.o. male.  HPI  With medical history including depression, anxiety, left knee surgeries presents with complaints of left knee pain, knee pain has been going on for about a month's time, states about 2 weeks ago  he slipped in the bathroom and worsen his left knee pain.  Pain is constant, is worsened with movement and ambulation, he states he still able to walk,  denies any paresthesia or weakness moving down his leg, denies any leg swelling or redness around his knee.  No history of PEs or DVTs currently not on hormone therapy denies any chest pain or shortness of breath.  Review patient's chart was seen 2 weeks ago for similar presentation imaging was unremarkable and was sent home.  Patient's partner is at bedside states he is following up with a knee surgeon on Wednesday to review his knee MRI.  Home Medications Prior to Admission medications   Medication Sig Start Date End Date Taking? Authorizing Provider  oxyCODONE-acetaminophen (PERCOCET/ROXICET) 5-325 MG tablet Take 1 tablet by mouth every 8 (eight) hours as needed for up to 3 days for severe pain. 07/21/21 07/24/21 Yes Carroll Sage, PA-C  predniSONE (DELTASONE) 10 MG tablet Take 3 tablets (30 mg total) by mouth daily for 5 days. 07/21/21 07/26/21 Yes Carroll Sage, PA-C  acetaminophen (TYLENOL) 500 MG tablet Take 1 tablet (500 mg total) by mouth every 6 (six) hours as needed. 11/01/20   Fayrene Helper, PA-C  benzonatate (TESSALON) 100 MG capsule Take 1 capsule (100 mg total) by mouth 3 (three) times daily as needed for cough. 11/01/20   Fayrene Helper, PA-C  DULoxetine (CYMBALTA) 20 MG capsule TAKE 1 CAPSULE BY MOUTH EVERY DAY 07/30/20   Toy Cookey E, NP  traZODone (DESYREL) 50 MG tablet Take 1 tablet (50 mg total) by  mouth at bedtime as needed. for sleep 07/30/20   Shanna Cisco, NP      Allergies    Patient has no known allergies.    Review of Systems   Review of Systems  Constitutional:  Negative for chills and fever.  Respiratory:  Negative for shortness of breath.   Cardiovascular:  Negative for chest pain.  Gastrointestinal:  Negative for abdominal pain.  Musculoskeletal:        Left knee pain  Neurological:  Negative for headaches.   Physical Exam Updated Vital Signs BP (!) 151/102 (BP Location: Left Arm)   Pulse (!) 106   Temp 98.4 F (36.9 C) (Oral)   Resp 18   Ht 5\' 8"  (1.727 m)   Wt 81.6 kg   SpO2 98%   BMI 27.37 kg/m  Physical Exam Vitals and nursing note reviewed.  Constitutional:      General: He is not in acute distress.    Appearance: He is not ill-appearing.  HENT:     Head: Normocephalic and atraumatic.     Nose: No congestion.  Eyes:     Conjunctiva/sclera: Conjunctivae normal.  Cardiovascular:     Rate and Rhythm: Normal rate and regular rhythm.     Pulses: Normal pulses.  Pulmonary:     Effort: Pulmonary effort is normal.  Musculoskeletal:     Comments: Focused exam on the left knee reveals no overlying skin changes no edema, no  gross deformities present.  He has full range of motion his toes ankle and knee, there is no crepitus present with passive range of motion at the knee, no joint laxity, slight tenderness at the popliteal area without palpable cords no calf tenderness no unilateral leg swelling.  Skin:    General: Skin is warm and dry.  Neurological:     Mental Status: He is alert.  Psychiatric:        Mood and Affect: Mood normal.    ED Results / Procedures / Treatments   Labs (all labs ordered are listed, but only abnormal results are displayed) Labs Reviewed - No data to display  EKG None  Radiology No results found.  Procedures Procedures    Medications Ordered in ED Medications  HYDROmorphone (DILAUDID) injection 1 mg (1 mg  Intramuscular Given 07/21/21 0044)    ED Course/ Medical Decision Making/ A&P                           Medical Decision Making Risk Prescription drug management.   This patient presents to the ED for concern of knee pain, this involves an extensive number of treatment options, and is a complaint that carries with it a high risk of complications and morbidity.  The differential diagnosis includes fracture, dislocation, compartment syndrome    Additional history obtained:  Additional history obtained from partner at bedside External records from outside source obtained and reviewed including previous ER notes, imaging, medication list   Co morbidities that complicate the patient evaluation  Previous left knee surgeries  Social Determinants of Health:  N/A    Lab Tests:  I Ordered, and personally interpreted labs.  The pertinent results include: N/A   Imaging Studies ordered:  I ordered imaging studies including N/A I independently visualized and interpreted imaging which showed N/A I agree with the radiologist interpretation   Cardiac Monitoring:  The patient was maintained on a cardiac monitor.  I personally viewed and interpreted the cardiac monitored which showed an underlying rhythm of: N/A   Medicines ordered and prescription drug management:  I ordered medication including Dilaudid for pain I have reviewed the patients home medicines and have made adjustments as needed  Critical Interventions:  N/A   Reevaluation:  Presents with left knee pain, benign physical exam, will provide pain medications, follow-up with orthopedics for further evaluation they are agreement this plan is ready for discharge.  Consultations Obtained:  N/A   Test Considered:  X-ray of left knee-this was deferred to my suspicion for fracture or dislocation is very low at this time no traumatic injury associate this pain not increased risk for pathological  fractures.    Rule out I have low suspicion for septic arthritis as patient denies IV drug use, skin exam was performed no erythematous, edematous, warm joints noted on exam.   low suspicion for ligament or tendon damage as area was palpated no gross defects noted, they had full range of motion as well as 5/5 strength.  Low suspicion for compartment syndrome as area was palpated it was soft to the touch, neurovascular fully intact.  I have low suspicion for DVT as she has no calf tenderness no palpable cords no unilateral leg swelling present.     Dispostion and problem list  After consideration of the diagnostic results and the patients response to treatment, I feel that the patent would benefit from discharge.  Knee pain-likely acute on chronic, will provide with  a dose of pain medication in the emergency department, start him on pain medication and steroids,  DVT is very low but cannot fully exclude will recommend outpatient DVT study for further evaluation.  Follow-up with Ortho Ortho for further evaluation.            Final Clinical Impression(s) / ED Diagnoses Final diagnoses:  Acute pain of left knee    Rx / DC Orders ED Discharge Orders          Ordered    oxyCODONE-acetaminophen (PERCOCET/ROXICET) 5-325 MG tablet  Every 8 hours PRN        07/21/21 0053    predniSONE (DELTASONE) 10 MG tablet  Daily        07/21/21 0053    LE VENOUS        07/21/21 0053              Carroll SageFaulkner, Courtne Lighty J, PA-C 07/21/21 0055    Nira Connardama, Pedro Eduardo, MD 07/21/21 [phone]

## 2021-07-29 ENCOUNTER — Encounter: Payer: Self-pay | Admitting: Nurse Practitioner

## 2021-10-27 ENCOUNTER — Ambulatory Visit: Payer: BC Managed Care – PPO | Admitting: Nurse Practitioner

## 2022-03-04 ENCOUNTER — Ambulatory Visit: Payer: BC Managed Care – PPO | Admitting: Nurse Practitioner

## 2022-05-06 ENCOUNTER — Encounter (HOSPITAL_COMMUNITY): Payer: Self-pay

## 2022-05-06 ENCOUNTER — Emergency Department (HOSPITAL_COMMUNITY): Payer: Self-pay

## 2022-05-06 ENCOUNTER — Ambulatory Visit: Payer: Self-pay | Attending: Nurse Practitioner | Admitting: Nurse Practitioner

## 2022-05-06 ENCOUNTER — Emergency Department (HOSPITAL_COMMUNITY)
Admission: EM | Admit: 2022-05-06 | Discharge: 2022-05-06 | Disposition: A | Discharge status: Home / Self Care | Payer: Self-pay | Attending: Emergency Medicine | Admitting: Emergency Medicine

## 2022-05-06 DIAGNOSIS — M545 Low back pain, unspecified: Secondary | ICD-10-CM | POA: Insufficient documentation

## 2022-05-06 DIAGNOSIS — M25552 Pain in left hip: Secondary | ICD-10-CM | POA: Insufficient documentation

## 2022-05-06 MED ORDER — ACETAMINOPHEN 325 MG PO TABS
650.0000 mg | ORAL_TABLET | Freq: Once | ORAL | Status: AC
  Administered 2022-05-06: 650 mg via ORAL
  Filled 2022-05-06: qty 2

## 2022-05-06 MED ORDER — LIDOCAINE 5 % EX PTCH
1.0000 | MEDICATED_PATCH | Freq: Once | CUTANEOUS | Status: DC
  Administered 2022-05-06: 1 via TRANSDERMAL
  Filled 2022-05-06: qty 1

## 2022-05-06 NOTE — ED Triage Notes (Signed)
Pt states that he has been having L hip pain for the past month, worse since Sat, denies new injury, was helping someone move this weekend.

## 2022-05-06 NOTE — ED Provider Notes (Signed)
[facility] Provider Note   CSN: [id number] Arrival date & time: 05/06/22  [id number]     History  Chief Complaint  Patient presents with   Hip Pain    Andrew Turner is a 32 y.o. male who presents emergency department with concerns for left hip pain that has been going on for more than a year.  Notes that his pain was exacerbated 4 days ago after helping his sister move.  Has lower back pain that has been ongoing x 1 year as well. Has been taking ibuprofen for his symptoms.  Notes that his symptoms were worse while at work tonight.  Denies bowel/bladder incontinence, numbness, tingling, urinary symptoms.  The history is provided by the patient. No language interpreter was used.       Home Medications Prior to Admission medications   Medication Sig Start Date End Date Taking? Authorizing Provider  acetaminophen (TYLENOL) 500 MG tablet Take 1 tablet (500 mg total) by mouth every 6 (six) hours as needed. 11/01/20   Domenic Moras, PA-C  benzonatate (TESSALON) 100 MG capsule Take 1 capsule (100 mg total) by mouth 3 (three) times daily as needed for cough. 11/01/20   Domenic Moras, PA-C  DULoxetine (CYMBALTA) 20 MG capsule TAKE 1 CAPSULE BY MOUTH EVERY DAY 07/30/20   Eulis Canner E, NP  traZODone (DESYREL) 50 MG tablet Take 1 tablet (50 mg total) by mouth at bedtime as needed. for sleep 07/30/20   Salley Slaughter, NP      Allergies    Patient has no known allergies.    Review of Systems   Review of Systems  All other systems reviewed and are negative.   Physical Exam Updated Vital Signs BP 134/83 (BP Location: Left Arm)   Pulse 98   Temp 97.9 F (36.6 C) (Oral)   Resp 18   SpO2 98%  Physical Exam Vitals and nursing note reviewed.  Constitutional:      General: He is not in acute distress.    Appearance: Normal appearance.  Eyes:     General: No scleral icterus.    Extraocular Movements: Extraocular movements intact.  Cardiovascular:      Rate and Rhythm: Normal rate.  Pulmonary:     Effort: Pulmonary effort is normal. No respiratory distress.  Abdominal:     Palpations: Abdomen is soft. There is no mass.     Tenderness: There is no abdominal tenderness.  Musculoskeletal:        General: Normal range of motion.     Cervical back: Neck supple.     Comments: No spinal TTP. TTP noted to musculature of left lumbar region. TTP noted to lateral left hip with full ROM. Mild TTP noted to posterior aspect of left hip. Strength intact to BLE. Negative SLR bilaterally.   Skin:    General: Skin is warm and dry.     Findings: No rash.  Neurological:     Mental Status: He is alert.     Sensory: Sensation is intact.     Motor: Motor function is intact.  Psychiatric:        Behavior: Behavior normal.     ED Results / Procedures / Treatments   Labs (all labs ordered are listed, but only abnormal results are displayed) Labs Reviewed - No data to display  EKG None  Radiology DG Hip Unilat W or Wo Pelvis 2-3 Views Left  Result Date: 05/06/2022 CLINICAL DATA:  32 year old male with increasing  hip pain over the past month. No known injury. EXAM: DG HIP (WITH OR WITHOUT PELVIS) 2-3V LEFT COMPARISON:  None Available. FINDINGS: Bone mineralization is within normal limits. Femoral heads appear normally located. Hip joint spaces appear symmetric and within normal limits. Intact pelvis. Symphysis and SI joints appear within normal limits. Grossly intact proximal right femur. Proximal left femur appears intact and normal on AP and frogleg lateral views. Negative lower abdominal and pelvic visceral contours. IMPRESSION: Negative. Electronically Signed   By: Genevie Ann M.D.   On: 05/06/2022 05:59    Procedures Procedures    Medications Ordered in ED Medications  lidocaine (LIDODERM) 5 % 1 patch (1 patch Transdermal Patch Applied 05/06/22 0438)  acetaminophen (TYLENOL) tablet 650 mg (650 mg Oral Given 05/06/22 0437)    ED Course/ Medical  Decision Making/ A&P Clinical Course as of 05/06/22 0609  Wed May 06, 2022  0606 Patient asleep on stretcher on reevaluation.  Discussed with patient imaging findings as well as discharge treatment plan.  Answered all available questions.  Patient appears safe for discharge at this time. [SB]    Clinical Course User Index [SB] Tahmir Kleckner A, PA-C                             Medical Decision Making Amount and/or Complexity of Data Reviewed Radiology: ordered.  Risk OTC drugs. Prescription drug management.   Patient with left hip pain onset 1 year worse 4 days ago. Vital signs pt afebrile. On exam, patient with No spinal TTP. TTP noted to musculature of left lumbar region. TTP noted to lateral left hip with full ROM. Mild TTP noted to posterior aspect of left hip. Strength intact to BLE. Negative SLR bilaterally. Differential diagnosis includes fracture, dislocation, sprain, IT band inflammation.    Imaging: I ordered imaging studies including left hip xray I independently visualized and interpreted imaging which showed: no acute findings I agree with the radiologist interpretation  Medications:  I ordered medication including lidoderm patch, warm compress, tylenol for symptom management  I have reviewed the patients home medicines and have made adjustments as needed    Disposition: Presentation suspicious for left hip pain. Doubt fracture, dislocation, sprain, or IT band inflammation at this time. After consideration of the diagnostic results and the patients response to treatment, I feel that the patient would benefit from Discharge home.  Work note provided. Info provided for on-call ortho for follow up. Supportive care measures and strict return precautions discussed with patient at bedside. Pt acknowledges and verbalizes understanding. Pt appears safe for discharge. Follow up as indicated in discharge paperwork.    This chart was dictated using voice recognition software,  Dragon. Despite the best efforts of this provider to proofread and correct errors, errors may still occur which can change documentation meaning.   Final Clinical Impression(s) / ED Diagnoses Final diagnoses:  Left hip pain    Rx / DC Orders ED Discharge Orders     None         Homer Miller A, PA-C 05/06/22 [id number]    Ezequiel Essex, MD 05/06/22 [id number]

## 2022-05-06 NOTE — Discharge Instructions (Addendum)
It was a pleasure taking care of you today!  Your hip x-ray did not show any emergent findings today.  You may take over-the-counter tylenol every 6 hours and alternate with 600 mg ibuprofen every 6 hours for no more than 7 days.  You may place ice or heat to the affected area for 15 minutes at a time.  Ensure to place a barrier between your skin and the ice/heat.  Remove the lidoderm patch in 12 hours from its initial placement time. You may follow-up with your primary care provider regarding today's ED visit.  If not have a primary care provider you may follow-up with the [facility] or establish care via the phone number attached under health connect.  Return to the emergency department if you experience increasing/worsening symptoms.

## 2022-05-11 DIAGNOSIS — M25552 Pain in left hip: Secondary | ICD-10-CM | POA: Insufficient documentation

## 2022-05-12 ENCOUNTER — Emergency Department (HOSPITAL_COMMUNITY)
Admission: EM | Admit: 2022-05-12 | Discharge: 2022-05-12 | Disposition: A | Discharge status: Home / Self Care | Payer: Self-pay | Attending: Emergency Medicine | Admitting: Emergency Medicine

## 2022-05-12 DIAGNOSIS — M25552 Pain in left hip: Secondary | ICD-10-CM

## 2022-05-12 MED ORDER — PREDNISONE 50 MG PO TABS: ORAL_TABLET | ORAL | 0 refills | Status: DC

## 2022-05-12 MED ORDER — KETOROLAC TROMETHAMINE 15 MG/ML IJ SOLN
30.0000 mg | Freq: Once | INTRAMUSCULAR | Status: AC
  Administered 2022-05-12: 30 mg via INTRAMUSCULAR
  Filled 2022-05-12 (×2): qty 2

## 2022-05-12 NOTE — ED Triage Notes (Signed)
Reports L hip pain worsening over the last month. Was seen last week and states that he has been doing the recommended tylenol and ibuprofen. States that pain is worse with bending. States that its is starting to hurt more than not. He was put on light duty, but needs something stating what light-duty is. Has not been able to see ortho yet d/t lack insurance.

## 2022-05-12 NOTE — ED Provider Notes (Signed)
[place] EMERGENCY DEPARTMENT AT [facility] Provider Note   CSN: [id number] Arrival date & time: 05/11/22  2353     History  Chief Complaint  Patient presents with   Hip Pain    Andrew Turner is a 32 y.o. male.  The history is provided by the patient.   Patient presents for worsening left hip pain.  No falls or trauma.  It hurts to walk.  He reports this is been ongoing for several weeks.  No previous hip or back surgery.  No leg weakness.  No incontinence.  He was seen recently for the same thing, but was never given full instructions on light duty.  He is also unable to follow-up with orthopedics due to lack of insurance   Denies any other major medical conditions.  He is not diabetic Home Medications Prior to Admission medications   Medication Sig Start Date End Date Taking? Authorizing Provider  predniSONE (DELTASONE) 50 MG tablet 1 tablet PO QD X7 days 05/12/22  Yes Ripley Fraise, MD  acetaminophen (TYLENOL) 500 MG tablet Take 1 tablet (500 mg total) by mouth every 6 (six) hours as needed. 11/01/20   Domenic Moras, PA-C  DULoxetine (CYMBALTA) 20 MG capsule TAKE 1 CAPSULE BY MOUTH EVERY DAY 07/30/20   Eulis Canner E, NP  traZODone (DESYREL) 50 MG tablet Take 1 tablet (50 mg total) by mouth at bedtime as needed. for sleep 07/30/20   Salley Slaughter, NP      Allergies    Patient has no known allergies.    Review of Systems   Review of Systems  Physical Exam Updated Vital Signs BP (!) 128/102   Pulse 92   Temp 98.7 F (37.1 C) (Oral)   Resp 17   SpO2 99%  Physical Exam CONSTITUTIONAL: Well developed/well nourished HEAD: Normocephalic/atraumatic EYES: EOMI/PERRL ENMT: Mucous membranes moist NECK: supple no meningeal signs SPINE/BACK:entire spine nontender mild lumbar paraspinal tenderness No bruising/crepitance/stepoffs noted to spine NEURO: Awake/alert,  equal motor 5/5 strength noted with the following: hip flexion/knee flexion/extension, foot  dorsi/plantar flexion, no sensory deficit in any dermatome.   Pt is able to ambulate unassisted. EXTREMITIES: pulses normal, full ROM Mild tenderness of range of motion of left hip.  No deformities.  No erythema noted over the left hip. SKIN: warm, color normal PSYCH: no abnormalities of mood noted, alert and oriented to situation  ED Results / Procedures / Treatments   Labs (all labs ordered are listed, but only abnormal results are displayed) Labs Reviewed - No data to display  EKG None  Radiology No results found.  Procedures Procedures    Medications Ordered in ED Medications  ketorolac (TORADOL) 15 MG/ML injection 30 mg (30 mg Intramuscular Given 05/12/22 0255)    ED Course/ Medical Decision Making/ A&P                             Medical Decision Making Risk Prescription drug management.   Patient presents with worsening pain in his left hip.  Given the location of his pain, this could also be referred from his back as a lumbosacral radiculopathy.  He has no focal neurodeficits.  No signs of septic joint.   Will give a short burst of prednisone.  Patient is without health insurance, therefore will consult social work to assist with outpatient follow-up with primary care.  He was given light duty instructions for 1 week.  Final Clinical Impression(s) / ED Diagnoses Final diagnoses:  Left hip pain    Rx / DC Orders ED Discharge Orders          Ordered    predniSONE (DELTASONE) 50 MG tablet        05/12/22 0245              Ripley Fraise, MD 05/12/22 [phone]

## 2022-05-12 NOTE — Discharge Instructions (Addendum)

## 2022-05-21 ENCOUNTER — Telehealth: Payer: Self-pay | Admitting: Nurse Practitioner

## 2022-05-21 ENCOUNTER — Ambulatory Visit (INDEPENDENT_AMBULATORY_CARE_PROVIDER_SITE_OTHER): Payer: Self-pay | Admitting: Nurse Practitioner

## 2022-05-21 ENCOUNTER — Encounter: Payer: Self-pay | Admitting: Nurse Practitioner

## 2022-05-21 VITALS — BP 132/87 | HR 81 | Temp 97.8°F | Ht 68.0 in | Wt 189.0 lb

## 2022-05-21 DIAGNOSIS — G8929 Other chronic pain: Secondary | ICD-10-CM | POA: Insufficient documentation

## 2022-05-21 DIAGNOSIS — M546 Pain in thoracic spine: Secondary | ICD-10-CM

## 2022-05-21 MED ORDER — KETOROLAC TROMETHAMINE 30 MG/ML IJ SOLN
30.0000 mg | Freq: Once | INTRAMUSCULAR | Status: AC
  Administered 2022-05-21: 30 mg via INTRAMUSCULAR

## 2022-05-21 NOTE — Assessment & Plan Note (Signed)
-   Ambulatory referral to Orthopedics - ketorolac (TORADOL) 30 MG/ML injection 30 mg -financial assistance paperwork given to patient today  Follow up:  Follow up in 3 months

## 2022-05-21 NOTE — Telephone Encounter (Signed)
FMLA completed during office visit today. Given to Vonzell Schlatter, RMA to fax. Copy given to patient.

## 2022-05-21 NOTE — Patient Instructions (Signed)
1. Chronic left-sided thoracic back pain  - Ambulatory referral to Orthopedics - ketorolac (TORADOL) 30 MG/ML injection 30 mg -financial assistance paperwork given to patient today  Follow up:  Follow up in 3 months

## 2022-05-21 NOTE — Progress Notes (Signed)
@Patient  ID: Andrew Turner, male    DOB: 1990-09-21, 32 y.o.   MRN: [id number]  Chief Complaint  Patient presents with   [facility] follow up    Back Pain    Referring provider: No ref. provider found   HPI  Patient presents today to establish care to get FMLA paperwork filled out for his job.  Patient was seen in the ED on 05/12/2022 for hip pain.  X-ray of hip was negative.  Patient states that he is still having mid to low back pain and left hip pain.  Patient was referred to Ortho but states that they wanted money upfront to see him but he could not afford this.  Patient just recently started working at [employer] and does need FMLA filled out and office today.  Patient was treated with prednisone.  He states that he does continue to have pain.  The ED recommend following duty patient states that he will need to take due to not having light duty available denies f/c/s, n/v/d, hemoptysis, PND, leg swelling Denies chest pain or edema    No Known Allergies   There is no immunization history on file for this patient.  Past Medical History:  Diagnosis Date   Anxiety    Depression    Irregular heart rhythm    Pneumonia due to COVID-19 virus     Tobacco History: Social History   Tobacco Use  Smoking Status Former  Smokeless Tobacco Never   Counseling given: Not Answered   Outpatient Encounter Medications as of 05/21/2022  Medication Sig   ibuprofen (ADVIL) 400 MG tablet Take 400 mg by mouth every 6 (six) hours as needed.   predniSONE (DELTASONE) 50 MG tablet 1 tablet PO QD X7 days   acetaminophen (TYLENOL) 500 MG tablet Take 1 tablet (500 mg total) by mouth every 6 (six) hours as needed.   DULoxetine (CYMBALTA) 20 MG capsule TAKE 1 CAPSULE BY MOUTH EVERY DAY   traZODone (DESYREL) 50 MG tablet Take 1 tablet (50 mg total) by mouth at bedtime as needed. for sleep   [EXPIRED] ketorolac (TORADOL) 30 MG/ML injection 30 mg    No facility-administered encounter medications  on file as of 05/21/2022.     Review of Systems  Review of Systems  Constitutional: Negative.   HENT: Negative.    Cardiovascular: Negative.   Gastrointestinal: Negative.   Musculoskeletal:  Positive for back pain.  Allergic/Immunologic: Negative.   Neurological: Negative.   Psychiatric/Behavioral: Negative.         Physical Exam  BP 132/87   Pulse 81   Temp 97.8 F (36.6 C)   Ht 5\' 8"  (1.727 m)   Wt 189 lb (85.7 kg)   SpO2 98%   BMI 28.74 kg/m   Wt Readings from Last 5 Encounters:  05/21/22 189 lb (85.7 kg)  07/20/21 180 lb (81.6 kg)  07/11/21 180 lb (81.6 kg)  11/01/20 177 lb 6.4 oz (80.5 kg)  07/06/20 172 lb (78 kg)     Physical Exam Vitals and nursing note reviewed.  Constitutional:      General: He is not in acute distress.    Appearance: He is well-developed.  Cardiovascular:     Rate and Rhythm: Normal rate and regular rhythm.  Pulmonary:     Effort: Pulmonary effort is normal.     Breath sounds: Normal breath sounds.  Skin:    General: Skin is warm and dry.  Neurological:     Mental Status:  He is alert and oriented to person, place, and time.      Lab Results:  CBC No results found for: "WBC", "RBC", "HGB", "HCT", "PLT", "MCV", "MCH", "MCHC", "RDW", "LYMPHSABS", "MONOABS", "EOSABS", "BASOSABS"  BMET No results found for: "NA", "K", "CL", "CO2", "GLUCOSE", "BUN", "CREATININE", "CALCIUM", "GFRNONAA", "GFRAA"  BNP No results found for: "BNP"  ProBNP No results found for: "PROBNP"  Imaging: DG Hip Unilat W or Wo Pelvis 2-3 Views Left  Result Date: 05/06/2022 CLINICAL DATA:  31 year old male with increasing hip pain over the past month. No known injury. EXAM: DG HIP (WITH OR WITHOUT PELVIS) 2-3V LEFT COMPARISON:  None Available. FINDINGS: Bone mineralization is within normal limits. Femoral heads appear normally located. Hip joint spaces appear symmetric and within normal limits. Intact pelvis. Symphysis and SI joints appear within normal  limits. Grossly intact proximal right femur. Proximal left femur appears intact and normal on AP and frogleg lateral views. Negative lower abdominal and pelvic visceral contours. IMPRESSION: Negative. Electronically Signed   By: Genevie Ann M.D.   On: 05/06/2022 05:59     Assessment & Plan:   Chronic left-sided thoracic back pain - Ambulatory referral to Orthopedics - ketorolac (TORADOL) 30 MG/ML injection 30 mg -financial assistance paperwork given to patient today  Follow up:  Follow up in 3 months     Fenton Foy, NP 05/21/2022

## 2022-05-22 NOTE — Telephone Encounter (Signed)
Per [facility] this has been done . [place]

## 2022-05-28 ENCOUNTER — Ambulatory Visit (INDEPENDENT_AMBULATORY_CARE_PROVIDER_SITE_OTHER): Payer: Self-pay | Admitting: Sports Medicine

## 2022-05-28 ENCOUNTER — Other Ambulatory Visit (INDEPENDENT_AMBULATORY_CARE_PROVIDER_SITE_OTHER): Payer: Self-pay

## 2022-05-28 ENCOUNTER — Telehealth: Payer: Self-pay | Admitting: Clinical

## 2022-05-28 DIAGNOSIS — M5442 Lumbago with sciatica, left side: Secondary | ICD-10-CM

## 2022-05-28 DIAGNOSIS — Z9889 Other specified postprocedural states: Secondary | ICD-10-CM

## 2022-05-28 DIAGNOSIS — M25552 Pain in left hip: Secondary | ICD-10-CM

## 2022-05-28 DIAGNOSIS — G8929 Other chronic pain: Secondary | ICD-10-CM

## 2022-05-28 MED ORDER — CYCLOBENZAPRINE HCL 5 MG PO TABS: 5.0000 mg | ORAL_TABLET | Freq: Every day | ORAL | 1 refills | Status: DC

## 2022-05-28 MED ORDER — CYCLOBENZAPRINE HCL 5 MG PO TABS: 5.0000 mg | ORAL_TABLET | Freq: Every day | ORAL | Status: DC

## 2022-05-28 NOTE — Telephone Encounter (Signed)
Integrated Behavioral Health Referral Note  05/28/2022 Name: Windsor Faz MRN: [id number] DOB: 20-Jul-1990 Aarish Forgette is a 32 y.o. year old male who sees Fenton Foy, NP for primary care. LCSW was consulted to assess patient's needs and assist the patient with Financial Difficulties related to lack of health coverage .  Interpreter: No.   Interpreter Name & Language: none  Assessment: Patient experiencing financial difficulties related to lack of health coverage.  Intervention: Patient called CSW with questions about the [place]FA/[place] application packet; he reports he received this application in the hospital. Patient also reported he has applied ofr Medicaid and is awaiting determination. CSW advised patient that we will need to get determination on Medicaid first; if denied, patient can then apply for [place]FA/[place]. Will plan to follow up with patient in a couple weeks regarding this.  Review of patient status, including review of consultants reports, relevant laboratory and other test results, and collaboration with appropriate care team members and the patient's provider was performed as part of comprehensive patient evaluation and provision of services.    Estanislado Emms, [facility] [phone]

## 2022-05-28 NOTE — Progress Notes (Signed)
Andrew Turner - 32 y.o. male MRN [id number]  Date of birth: 19-May-1990  Office Visit Note: Visit Date: 05/28/2022 PCP: Fenton Foy, NP Referred by: Fenton Foy, NP  Subjective: Chief Complaint  Patient presents with   Lower Back - Pain   HPI: Andrew Turner is a pleasant 32 y.o. male who presents today for acute on chronic low back pain and left hip pain.  Chart review: Was seen in ED on 05/06/2022 for left hip pain, was exacerbated when he was helping his sister.  Had x-rays of the hip are negative for acute abnormality.  Chart review: Was seen again for left hip pain on 05/12/2022 and thought maybe this was coming from the lumbar spine.  He was prescribed prednisone 50 mg x 7 days.  In discussion with Andrew Turner today, he states he feels most of the pain is coming from the back. He has had pain here and the hip for over 1 year, but recently much worse since beginning of March. Did help his sister move af ew weeks ago and this worsened the pain. Works at [employer] but has been unable to work 2/2 to pain and restrictions needed. States the prednisone did not help his pain. Had a muscle relaxer in the past that did help improve his back spasms. Takes ibuprofen once daily with mild relief.   Pain is midline of the upper and lower back, worse lower. Feels muscles are stiff and will spasm at times. Worse with bending over/flexing at waist. Sometimes pain will radiate from the low back into the posterolateral hip/buttock. No N/T down the leg or into toes.   Denies any recent weight loss, no fever/chills, no bowel or bladder incontinence.   He also notes he feels he may be walking differently 2/2 to his chronic left knee pain. Had ACL, MCL, meniscus tear in 2007 --> no surgery to correct until May of 2011. Reports he retore his ACL in Dec 2011. Then reports he tore it again in 2014, but has not had surgery to correct this. 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 Seeing outside orthopedic for this in  past.  Pertinent ROS were reviewed with the patient and found to be negative unless otherwise specified above in HPI.   Assessment & Plan: Visit Diagnoses:  1. Chronic bilateral low back pain with left-sided sciatica   2. Pain in left hip   3. History of repair of ACL    Plan: Discussed with Andrew Turner that etiology of his back and hip pain. He certainly has muscle hypertonicity and does report spasms of the low back, so some of this is coming from soft tissue pathology. He does have a mildly positive SLR and with the pain radiating from the low back into the posterolateral hip, I am suspicious for possible radiculopathy. His hip exam and x-rays are benign. It is prudent to get him into formalized PT, referral was sent today. We will start him on Flexeril 5-10mg  qHS for spasms/back tightness. He is not to take this during the day if driving/working. He can continue Ibuprofen as needed for pain. Given his benign x-rays, I did provide a work note for him to return as his pain allows, no gross restrictions as of now. Would like to see him back 6-weeks after starting PT to re-evaluate. If no better, may need MRI of lumbar spine. He is currently self-pay, he brought paperwork in for [facility], did help and have our staff help  with this process. Sent message to our financial assistance individual (sent MyChart message to [facility] with phone #) in attempt to help with this and insurance coverage so his visit and future physical therapy appts can be covered. He will f/u in 6-weeks after starting PT - may see me or our spine physician, Dr. Laurance Flatten.  Follow-up: Return for f/u 6 weeks after beginning PT for low back (may see Dr. Laurance Flatten or myself).   Meds & Orders:  Meds ordered this encounter  Medications   DISCONTD: cyclobenzaprine (FLEXERIL) tablet 5 mg   cyclobenzaprine (FLEXERIL) 5 MG tablet    Sig: Take 1-2 tablets (5-10 mg total) by mouth at bedtime.    Dispense:  30 tablet    Refill:  1     Orders Placed This Encounter  Procedures   XR Lumbar Spine 2-3 Views   Ambulatory referral to Physical Therapy     Procedures: No procedures performed      Clinical History: No specialty comments available.  He reports that he has quit smoking. He has never used smokeless tobacco. No results for input(s): "HGBA1C", "LABURIC" in the last 8760 hours.  Objective:    Physical Exam  Gen: Well-appearing, in no acute distress; non-toxic CV: Regular Rate. Well-perfused. Warm.  Resp: Breathing unlabored on room air; no wheezing. Psych: Fluid speech in conversation; appropriate affect; normal thought process Neuro: Sensation intact throughout. No gross coordination deficits.   Ortho Exam - Lumbar spine: No gross deformity or scoliosis.  No overlying skin changes.  There is mild TTP at the L1-L2 junction both on the left and right of the spinous process.  There is reciprocal hypertonicity bilaterally of the upper and lower lumbar spine.  There is full range of motion in flexion and extension, but patient has pain at endrange of both motions.  5/5 strength of the lower extremity in the L4-S1 nerve root distribution bilaterally.  There is 2+ patellar and Achilles reflexes bilaterally.  There is a mildly positive straight leg raise on the left  - Left hip: No bony TTP, no greater trochanter TTP.  No redness or swelling.  There is fluid range of motion with internal and external logroll.  Negative FADIR, negative FABER testing.  5/5 strength about the hip.  Imaging: XR Lumbar Spine 2-3 Views  Result Date: 05/28/2022 2 views of the lumbar spine including AP and lateral film were ordered and reviewed by myself.  X-rays demonstrate no scoliosis.  There is preserved and intervertebral disc spaces.  There is no acute fracture.  No antero/retrolisthesis. No significant SI joint sclerosis.    *Independent review of the left hip x-ray from 05/06/2022 was reviewed and interpreted by myself.  3 views  including AP, lateral and frog-leg lateral were obtained.  X-rays demonstrate preserved joint spaces.  Femoral head is well located.  There is a slightly elongated femoral neck.  No acute fracture or otherwise bony abnormality noted.  Narrative & Impression  CLINICAL DATA:  32 year old male with increasing hip pain over the past month. No known injury.   EXAM: DG HIP (WITH OR WITHOUT PELVIS) 2-3V LEFT   COMPARISON:  None Available.   FINDINGS: Bone mineralization is within normal limits. Femoral heads appear normally located. Hip joint spaces appear symmetric and within normal limits. Intact pelvis. Symphysis and SI joints appear within normal limits. Grossly intact proximal right femur. Proximal left femur appears intact and normal on AP and frogleg lateral views. Negative lower abdominal and pelvic visceral contours.  IMPRESSION: Negative.     Electronically Signed   By: Genevie Ann M.D.   On: 05/06/2022 05:59    Narrative & Impression  CLINICAL DATA:  Twisted left knee with knee pain.   EXAM: LEFT KNEE - COMPLETE 4+ VIEW   COMPARISON:  07/06/2020.   FINDINGS: No acute fracture or dislocation. Mild degenerative changes are present in the medial and lateral compartments. Postsurgical changes are present at the lateral femoral condyle. No joint effusion.   IMPRESSION: 1. No acute fracture or dislocation. 2. Mild degenerative changes.     Electronically Signed   By: Brett Fairy M.D.   On: 07/11/2021 02:37     Past Medical/Family/Surgical/Social History: Medications & Allergies reviewed per EMR, new medications updated. Patient Active Problem List   Diagnosis Date Noted   Chronic left-sided thoracic back pain 05/21/2022   Moderate episode of recurrent major depressive disorder ([place]) 05/03/2020   Severe major depression with psychotic features ([place]) 05/03/2020   Generalized anxiety disorder 05/03/2020   Past Medical History:  Diagnosis Date   Anxiety     Depression    Irregular heart rhythm    Pneumonia due to COVID-19 virus    Family History  Problem Relation Age of Onset   Atrial fibrillation Mother    Past Surgical History:  Procedure Laterality Date   KNEE SURGERY Left    Social History   Occupational History   Not on file  Tobacco Use   Smoking status: Former   Smokeless tobacco: Never  Vaping Use   Vaping Use: Never used  Substance and Sexual Activity   Alcohol use: Not Currently   Drug use: Not Currently   Sexual activity: Not on file

## 2022-06-16 ENCOUNTER — Ambulatory Visit (INDEPENDENT_AMBULATORY_CARE_PROVIDER_SITE_OTHER): Payer: Medicaid Other | Admitting: Sports Medicine

## 2022-06-16 ENCOUNTER — Encounter: Payer: Self-pay | Admitting: Sports Medicine

## 2022-06-16 DIAGNOSIS — M545 Low back pain, unspecified: Secondary | ICD-10-CM

## 2022-06-16 DIAGNOSIS — G8929 Other chronic pain: Secondary | ICD-10-CM

## 2022-06-16 MED ORDER — MELOXICAM 15 MG PO TABS: 15.0000 mg | ORAL_TABLET | Freq: Every day | ORAL | 1 refills | Status: DC

## 2022-06-16 NOTE — Progress Notes (Signed)
Pain returned and worsened when he ran out of his muscle relaxers. He just started back on those yesterday. States he could not walk due to pain He is also taking ibuprofen with it. Denies any radicular pain  Has not started PT since he was just approved for [facility]

## 2022-06-16 NOTE — Progress Notes (Signed)
Andrew Turner - 32 y.o. male MRN [id number]  Date of birth: September 02, 1990  Office Visit Note: Visit Date: 06/16/2022 PCP: Ivonne Andrew, NP Referred by: Ivonne Andrew, NP  Subjective: Chief Complaint  Patient presents with   Lower Back - Pain   HPI: Andrew Turner is a pleasant 32 y.o. male who presents today for acute on chronic low back pain.  He continues with low back pain.  Did have to take off yesterday because of the pain.  He has since resumed his muscle relaxers, Flexeril 5 mg 3 times daily as needed which do not give some relief of pain.  Get some stiffness in the low back.  Previously he had left-sided sciatica, although his pain is over the bilateral back and more so on the right side today, denies any radicular symptoms currently.  He does work a physically demanding job which at times can exacerbate this pain.  He does tell me back the did have his Medicaid insurance go through and would like to start physical therapy now.  Pertinent ROS were reviewed with the patient and found to be negative unless otherwise specified above in HPI.   Assessment & Plan: Visit Diagnoses:  1. Chronic bilateral low back pain without sciatica    Plan: Discussed with Andrew Turner the nature of his low back pain.  We are somewhat suspicious for radiculopathy as he had previously left lower extremity shooting pain, this has improved but he still has pain in the back.  He now has insurance and so I would like to get him into formalized physical therapy to help strengthen and support the back.  We did send a message to the clinic today to reach out to him.  We will start him on meloxicam 15 mg to be taken once daily. He will continue on Flexeril 5-10mg  qHS for spasms/back tightness. I would like to see what sort of relief he gets from physical therapy and we will follow-up in about 6 weeks.  Additional treatment considerations: Trigger point injections, MRI of the lumbar spine, work restrictions  Follow-up:  Return in about 6 weeks (around 07/28/2022) for for low back pain (will be starting PT).   Meds & Orders:  Meds ordered this encounter  Medications   meloxicam (MOBIC) 15 MG tablet    Sig: Take 1 tablet (15 mg total) by mouth daily.    Dispense:  30 tablet    Refill:  1   No orders of the defined types were placed in this encounter.    Procedures: No procedures performed      Clinical History: No specialty comments available.  He reports that he has quit smoking. He has never used smokeless tobacco. No results for input(s): "HGBA1C", "LABURIC" in the last 8760 hours.  Objective:    Physical Exam  Gen: Well-appearing, in no acute distress; non-toxic CV:  Well-perfused. Warm.  Resp: Breathing unlabored on room air; no wheezing. Psych: Fluid speech in conversation; appropriate affect; normal thought process Neuro: Sensation intact throughout. No gross coordination deficits.   Ortho Exam - Lumbar: There is a mild TTP near the L3-L5 region bilaterally, although right greater than left.  There is reciprocal hypertonicity of the lower lumbar musculature.  He has some gentle range of motion with flexion but does worsen his pain, full range of motion with extension.  Negative straight leg raise today.  5/5 strength of bilateral lower extremities in the L4-S1 nerve root distribution.  Imaging:  XR Lumbar Spine 2-3  Views 2 views of the lumbar spine including AP and lateral film were ordered and  reviewed by myself.  X-rays demonstrate no scoliosis.  There is preserved  and intervertebral disc spaces.  There is no acute fracture.  No  antero/retrolisthesis. No significant SI joint sclerosis.    Past Medical/Family/Surgical/Social History: Medications & Allergies reviewed per EMR, new medications updated. Patient Active Problem List   Diagnosis Date Noted   Chronic left-sided thoracic back pain 05/21/2022   Moderate episode of recurrent major depressive disorder 05/03/2020   Severe  major depression with psychotic features 05/03/2020   Generalized anxiety disorder 05/03/2020   Past Medical History:  Diagnosis Date   Anxiety    Depression    Irregular heart rhythm    Pneumonia due to COVID-19 virus    Family History  Problem Relation Age of Onset   Atrial fibrillation Mother    Past Surgical History:  Procedure Laterality Date   KNEE SURGERY Left    Social History   Occupational History   Not on file  Tobacco Use   Smoking status: Former   Smokeless tobacco: Never  Vaping Use   Vaping Use: Never used  Substance and Sexual Activity   Alcohol use: Not Currently   Drug use: Not Currently   Sexual activity: Not on file

## 2022-06-24 ENCOUNTER — Emergency Department (HOSPITAL_COMMUNITY)
Admission: EM | Admit: 2022-06-24 | Discharge: 2022-06-24 | Disposition: A | Discharge status: Home / Self Care | Payer: Medicaid Other | Attending: Emergency Medicine | Admitting: Emergency Medicine

## 2022-06-24 ENCOUNTER — Encounter (HOSPITAL_COMMUNITY): Payer: Self-pay | Admitting: Emergency Medicine

## 2022-06-24 ENCOUNTER — Emergency Department (HOSPITAL_COMMUNITY): Payer: Medicaid Other

## 2022-06-24 ENCOUNTER — Ambulatory Visit
Admission: EM | Admit: 2022-06-24 | Discharge: 2022-06-24 | Disposition: A | Discharge status: Home / Self Care | Payer: Medicaid Other | Attending: Internal Medicine | Admitting: Internal Medicine

## 2022-06-24 ENCOUNTER — Other Ambulatory Visit: Payer: Self-pay

## 2022-06-24 DIAGNOSIS — R079 Chest pain, unspecified: Secondary | ICD-10-CM | POA: Insufficient documentation

## 2022-06-24 DIAGNOSIS — Z8616 Personal history of COVID-19: Secondary | ICD-10-CM | POA: Diagnosis not present

## 2022-06-24 DIAGNOSIS — R0789 Other chest pain: Secondary | ICD-10-CM | POA: Diagnosis not present

## 2022-06-24 LAB — CBC WITH DIFFERENTIAL/PLATELET
Abs Immature Granulocytes: 0.05 10*3/uL (ref 0.00–0.07)
Basophils Absolute: 0 10*3/uL (ref 0.0–0.1)
Basophils Relative: 0 %
Eosinophils Absolute: 0.1 10*3/uL (ref 0.0–0.5)
Eosinophils Relative: 2 %
HCT: 40.9 % (ref 39.0–52.0)
Hemoglobin: 13.9 g/dL (ref 13.0–17.0)
Immature Granulocytes: 1 %
Lymphocytes Relative: 32 %
Lymphs Abs: 3 10*3/uL (ref 0.7–4.0)
MCH: 29.2 pg (ref 26.0–34.0)
MCHC: 34 g/dL (ref 30.0–36.0)
MCV: 85.9 fL (ref 80.0–100.0)
Monocytes Absolute: 0.6 10*3/uL (ref 0.1–1.0)
Monocytes Relative: 6 %
Neutro Abs: 5.5 10*3/uL (ref 1.7–7.7)
Neutrophils Relative %: 59 %
Platelets: 307 10*3/uL (ref 150–400)
RBC: 4.76 MIL/uL (ref 4.22–5.81)
RDW: 12.6 % (ref 11.5–15.5)
WBC: 9.3 10*3/uL (ref 4.0–10.5)
nRBC: 0 % (ref 0.0–0.2)

## 2022-06-24 LAB — TROPONIN I (HIGH SENSITIVITY)
Troponin I (High Sensitivity): <2 ng/L (ref <18)
Troponin I (High Sensitivity): <2 ng/L (ref <18)

## 2022-06-24 LAB — BASIC METABOLIC PANEL
Anion gap: 10 (ref 5–15)
BUN: 14 mg/dL (ref 6–20)
CO2: 27 mmol/L (ref 22–32)
Calcium: 9.2 mg/dL (ref 8.9–10.3)
Chloride: 102 mmol/L (ref 98–111)
Creatinine, Ser: 0.92 mg/dL (ref 0.61–1.24)
GFR, Estimated: >60 mL/min (ref >60)
Glucose, Bld: 104 mg/dL — ABNORMAL HIGH (ref 70–99)
Potassium: 3.9 mmol/L (ref 3.5–5.1)
Sodium: 139 mmol/L (ref 135–145)

## 2022-06-24 LAB — MAGNESIUM: Magnesium: 1.8 mg/dL (ref 1.7–2.4)

## 2022-06-24 MED ORDER — ASPIRIN 325 MG PO TABS
325.0000 mg | ORAL_TABLET | Freq: Once | ORAL | Status: AC
  Administered 2022-06-24: 325 mg via ORAL
  Filled 2022-06-24: qty 1

## 2022-06-24 NOTE — ED Triage Notes (Signed)
Pt c/o "high heart rate all week," chest pain, left are tingling onset ~ 1 week ago.

## 2022-06-24 NOTE — Discharge Instructions (Signed)
Please go to the emergency department as soon you leave urgent care for further evaluation and management. 

## 2022-06-24 NOTE — ED Provider Notes (Signed)
[place] EMERGENCY DEPARTMENT AT [facility] Provider Note   CSN: [id number] Arrival date & time: 06/24/22  1213     History  Chief Complaint  Patient presents with   Chest Pain    Andrew Turner is a 32 y.o. male.   Chest Pain    Patient has a history of having COVID virus infection in the past as well as an episode of irregular heart rhythm back in 2012.  Patient has not required any continue treatment for either those conditions.  Patient came to the ED because he has been having trouble with chest pain rating to his left arm for the last several days.  Symptoms come and go.  It feels more like a pressure.  He denies any diaphoresis or shortness of breath.  No nausea.  Patient also noticed that his heart was beating fast yesterday up into the 120s.  Home Medications Prior to Admission medications   Medication Sig Start Date End Date Taking? Authorizing Provider  acetaminophen (TYLENOL) 500 MG tablet Take 1 tablet (500 mg total) by mouth every 6 (six) hours as needed. 11/01/20   Fayrene Helper, PA-C  cyclobenzaprine (FLEXERIL) 5 MG tablet Take 1-2 tablets (5-10 mg total) by mouth at bedtime. 05/28/22   Madelyn Brunner, DO  DULoxetine (CYMBALTA) 20 MG capsule TAKE 1 CAPSULE BY MOUTH EVERY DAY 07/30/20   Toy Cookey E, NP  ibuprofen (ADVIL) 400 MG tablet Take 400 mg by mouth every 6 (six) hours as needed.    [provider]  meloxicam (MOBIC) 15 MG tablet Take 1 tablet (15 mg total) by mouth daily. 06/16/22   Madelyn Brunner, DO  predniSONE (DELTASONE) 50 MG tablet 1 tablet PO QD X7 days 05/12/22   Zadie Rhine, MD  traZODone (DESYREL) 50 MG tablet Take 1 tablet (50 mg total) by mouth at bedtime as needed. for sleep 07/30/20   Shanna Cisco, NP      Allergies    Patient has no known allergies.    Review of Systems   Review of Systems  Cardiovascular:  Positive for chest pain.    Physical Exam Updated Vital Signs BP 110/84 (BP Location: Left Arm)   Pulse  87   Temp (!) 97.5 F (36.4 C) (Oral)   Resp 16   Ht 1.727 m (5\' 8" )   Wt 85.7 kg   SpO2 98%   BMI 28.74 kg/m  Physical Exam Vitals and nursing note reviewed.  Constitutional:      General: He is not in acute distress.    Appearance: He is well-developed.  HENT:     Head: Normocephalic and atraumatic.     Right Ear: External ear normal.     Left Ear: External ear normal.  Eyes:     General: No scleral icterus.       Right eye: No discharge.        Left eye: No discharge.     Conjunctiva/sclera: Conjunctivae normal.  Neck:     Trachea: No tracheal deviation.  Cardiovascular:     Rate and Rhythm: Normal rate and regular rhythm.  Pulmonary:     Effort: Pulmonary effort is normal. No respiratory distress.     Breath sounds: Normal breath sounds. No stridor. No wheezing or rales.  Abdominal:     General: Bowel sounds are normal. There is no distension.     Palpations: Abdomen is soft.     Tenderness: There is no abdominal tenderness. There is no guarding or  rebound.  Musculoskeletal:        General: No tenderness or deformity.     Cervical back: Neck supple.     Right lower leg: No tenderness. No edema.     Left lower leg: No tenderness. No edema.  Skin:    General: Skin is warm and dry.     Findings: No rash.  Neurological:     General: No focal deficit present.     Mental Status: He is alert.     Cranial Nerves: No cranial nerve deficit, dysarthria or facial asymmetry.     Sensory: No sensory deficit.     Motor: No abnormal muscle tone or seizure activity.     Coordination: Coordination normal.  Psychiatric:        Mood and Affect: Mood normal.     ED Results / Procedures / Treatments   Labs (all labs ordered are listed, but only abnormal results are displayed) Labs Reviewed  BASIC METABOLIC PANEL - Abnormal; Notable for the following components:      Result Value   Glucose, Bld 104 (*)    All other components within normal limits  CBC WITH  DIFFERENTIAL/PLATELET  MAGNESIUM  TROPONIN I (HIGH SENSITIVITY)  TROPONIN I (HIGH SENSITIVITY)    EKG EKG Interpretation  Date/Time:  Wednesday June 24 2022 13:03:06 EDT Ventricular Rate:  89 PR Interval:  132 QRS Duration: 84 QT Interval:  354 QTC Calculation: 430 R Axis:   85 Text Interpretation: Normal sinus rhythm When compared with ECG of 24-Jun-2022 11:46, PREVIOUS ECG IS PRESENT No significant change since last tracing Confirmed by Linwood Dibbles [phone]) on 06/24/2022 4:03:02 PM  Radiology DG Chest 2 View  Result Date: 06/24/2022 CLINICAL DATA:  Provided history: Chest pain. EXAM: CHEST - 2 VIEW COMPARISON:  Chest radiograph 05/30/2020. FINDINGS: Heart size within normal limits. No appreciable airspace consolidation. No evidence of pleural effusion or pneumothorax. No acute osseous abnormality identified. Degenerative changes of the spine. IMPRESSION: No evidence of active cardiopulmonary disease. Electronically Signed   By: Jackey Loge D.O.   On: 06/24/2022 13:28    Procedures Procedures    Medications Ordered in ED Medications  aspirin tablet 325 mg (325 mg Oral Given 06/24/22 1256)    ED Course/ Medical Decision Making/ A&P Clinical Course as of 06/24/22 1727  Wed Jun 24, 2022  1608 CBC metabolic panel normal.  Troponin normal [JK]  1608 Chest x-ray without acute findings [JK]  1719 Second troponin is normal [JK]    Clinical Course User Index [JK] Linwood Dibbles, MD             HEART Score: 1                Medical Decision Making Problems Addressed: Chest pain, unspecified type: acute illness or injury that poses a threat to life or bodily functions  Amount and/or Complexity of Data Reviewed Labs: ordered. Decision-making details documented in ED Course. Radiology: ordered and independent interpretation performed.   Patient presented to the ED for evaluation of chest pain.  ED workup reassuring.  Low risk heart score.  EKG and troponins were normal.  Doubt  acute coronary syndrome.  Low risk or PE.  PERC negative.  Doubt pulmonary embolism.  Patient is comfortable at this time.  His symptoms resolved after Tylenol.  Evaluation and diagnostic testing in the emergency department does not suggest an emergent condition requiring admission or immediate intervention beyond what has been performed at this time.  The patient  is safe for discharge and has been instructed to return immediately for worsening symptoms, change in symptoms or any other concerns.          Final Clinical Impression(s) / ED Diagnoses Final diagnoses:  Chest pain, unspecified type    Rx / DC Orders ED Discharge Orders     None         Linwood Dibbles, MD 06/24/22 1728

## 2022-06-24 NOTE — Discharge Instructions (Signed)
Test today in the ED were reassuring.  Follow-up with your primary care doctor to be rechecked.  Continue Tylenol as needed for pain discomfort.  Return to the ER as needed for worsening symptoms

## 2022-06-24 NOTE — ED Notes (Signed)
Patient transported to X-ray 

## 2022-06-24 NOTE — ED Notes (Signed)
Patient is being discharged from the Urgent Care and sent to the Emergency Department via self . Per haley, patient is in need of higher level of care due to cp. Patient is aware and verbalizes understanding of plan of care.  Vitals:   06/24/22 1136  BP: 121/82  Pulse: 81  Resp: 16  Temp: (!) 97.5 F (36.4 C)  SpO2: 98%

## 2022-06-24 NOTE — ED Triage Notes (Signed)
Pt reports he was seen at UC due to high HR, chest pain and left arm pain intermittently since yesterday

## 2022-06-24 NOTE — ED Provider Triage Note (Signed)
Emergency Medicine Provider Triage Evaluation Note  Andrew Turner , a 32 y.o. male  was evaluated in triage.  Pt complains of left-sided chest pain that radiates into the left arm.  Yesterday.  Seen at urgent care and referred for further evaluation.  Denies personal history of CAD.  Does report history of palpitations.  Not on any medications for that at this time.  Reports significant family history of heart disease.  Review of Systems  Positive: As above Negative: As above  Physical Exam  BP 117/89 (BP Location: Right Arm)   Pulse 90   Temp 98 F (36.7 C) (Oral)   Resp 18   Ht  (1.727 m)   Wt 85.7 kg   SpO2 99%   BMI 28.74 kg/m  Gen:   Awake, no distress   Resp:  Normal effort  MSK:   Moves extremities without difficulty  Other:    Medical Decision Making  Medically screening exam initiated at 12:51 PM.  Appropriate orders placed.  Zein Helbing was informed that the remainder of the evaluation will be completed by another provider, this initial triage assessment does not replace that evaluation, and the importance of remaining in the ED until their evaluation is complete.    Marita Kansas, PA-C 06/24/22 1253

## 2022-06-24 NOTE — ED Provider Notes (Signed)
[facility]    CSN: [id number] Arrival date & time: 06/24/22  1125      History   Chief Complaint Chief Complaint  Patient presents with   Chest Pain    HPI Andrew Turner is a 32 y.o. male.   Patient presents to the urgent care today with chest pain and feelings of palpitations.  Reports that palpitations and high heart rate have been present for about a week.  He has a watch that monitors his heart rate and reports it has been intermittently in the 120s to 130s.  Patient states that he is typically up moving around when this happens.  Chest pain started yesterday around 4:30 AM and is present in the left side of the chest.  He describes it as a pressure that feels like "someone is sitting on his chest".  He rates it 7/10 on the pain scale.  Reports yesterday he had some radiation of pain down his left arm.  He does have a headache but reports this is baseline for him.  He did feel little bit of dizziness yesterday but denies nausea, vomiting.  He does report some intermittent shortness of breath.  He reports he has a history of palpitations but this "feels different".  He was never seen by cardiology in the past for his palpitations.  Denies any other pertinent medical history or cardiac issues.  He has been taking meloxicam, cyclobenzaprine, and ibuprofen for his back pain.    Chest Pain   Past Medical History:  Diagnosis Date   Anxiety    Depression    Irregular heart rhythm    Pneumonia due to COVID-19 virus     Patient Active Problem List   Diagnosis Date Noted   Chronic left-sided thoracic back pain 05/21/2022   Moderate episode of recurrent major depressive disorder 05/03/2020   Severe major depression with psychotic features 05/03/2020   Generalized anxiety disorder 05/03/2020    Past Surgical History:  Procedure Laterality Date   KNEE SURGERY Left        Home Medications    Prior to Admission medications   Medication Sig Start Date End Date  Taking? Authorizing Provider  acetaminophen (TYLENOL) 500 MG tablet Take 1 tablet (500 mg total) by mouth every 6 (six) hours as needed. 11/01/20   Fayrene Helper, PA-C  cyclobenzaprine (FLEXERIL) 5 MG tablet Take 1-2 tablets (5-10 mg total) by mouth at bedtime. 05/28/22   Madelyn Brunner, DO  DULoxetine (CYMBALTA) 20 MG capsule TAKE 1 CAPSULE BY MOUTH EVERY DAY 07/30/20   Toy Cookey E, NP  ibuprofen (ADVIL) 400 MG tablet Take 400 mg by mouth every 6 (six) hours as needed.    [provider]  meloxicam (MOBIC) 15 MG tablet Take 1 tablet (15 mg total) by mouth daily. 06/16/22   Madelyn Brunner, DO  predniSONE (DELTASONE) 50 MG tablet 1 tablet PO QD X7 days 05/12/22   Zadie Rhine, MD  traZODone (DESYREL) 50 MG tablet Take 1 tablet (50 mg total) by mouth at bedtime as needed. for sleep 07/30/20   Shanna Cisco, NP    Family History Family History  Problem Relation Age of Onset   Atrial fibrillation Mother     Social History Social History   Tobacco Use   Smoking status: Former   Smokeless tobacco: Never  Vaping Use   Vaping Use: Never used  Substance Use Topics   Alcohol use: Not Currently   Drug use: Not Currently  Allergies   Patient has no known allergies.   Review of Systems Review of Systems Per HPI  Physical Exam Triage Vital Signs ED Triage Vitals [06/24/22 1136]  Enc Vitals Group     BP 121/82     Pulse Rate 81     Resp 16     Temp (!) 97.5 F (36.4 C)     Temp Source Oral     SpO2 98 %     Weight      Height      Head Circumference      Peak Flow      Pain Score 7     Pain Loc      Pain Edu?      Excl. in GC?    No data found.  Updated Vital Signs BP 121/82 (BP Location: Left Arm)   Pulse 81   Temp (!) 97.5 F (36.4 C) (Oral)   Resp 16   SpO2 98%   Visual Acuity Right Eye Distance:   Left Eye Distance:   Bilateral Distance:    Right Eye Near:   Left Eye Near:    Bilateral Near:     Physical Exam Constitutional:       General: He is not in acute distress.    Appearance: Normal appearance. He is not toxic-appearing or diaphoretic.     Comments: Sitting upright in chair with his head leaned back against the chair and appears to be in a lot of pain.  HENT:     Head: Normocephalic and atraumatic.  Eyes:     Extraocular Movements: Extraocular movements intact.     Conjunctiva/sclera: Conjunctivae normal.  Cardiovascular:     Rate and Rhythm: Normal rate and regular rhythm.     Pulses: Normal pulses.     Heart sounds: Normal heart sounds.  Pulmonary:     Effort: Pulmonary effort is normal. No respiratory distress.     Breath sounds: Normal breath sounds. No stridor. No wheezing, rhonchi or rales.  Chest:     Chest wall: No tenderness.  Neurological:     General: No focal deficit present.     Mental Status: He is alert and oriented to person, place, and time. Mental status is at baseline.  Psychiatric:        Mood and Affect: Mood normal.        Behavior: Behavior normal.        Thought Content: Thought content normal.        Judgment: Judgment normal.      UC Treatments / Results  Labs (all labs ordered are listed, but only abnormal results are displayed) Labs Reviewed - No data to display  EKG   Radiology No results found.  Procedures Procedures (including critical care time)  Medications Ordered in UC Medications - No data to display  Initial Impression / Assessment and Plan / UC Course  I have reviewed the triage vital signs and the nursing notes.  Pertinent labs & imaging results that were available during my care of the patient were reviewed by me and considered in my medical decision making (see chart for details).     EKG was negative for any obvious acute abnormality.  Although, with the description of patient's discomfort and pain, I am concerned for cardiac etiology.  Therefore, patient was advised that he would need to go to the ER for further evaluation and management.   Other differentials include PE which needs to be ruled out.  Patient was agreeable to going to the ER.  Suggested ambulance transportation but patient declined wishing for his friend to transport him.  Risks associated with not going by EMS were discussed with patient.  Patient voiced understanding, accepted risks, and left via self transport. Final Clinical Impressions(s) / UC Diagnoses   Final diagnoses:  Other chest pain     Discharge Instructions      Please go to the emergency department as soon you leave urgent care for further evaluation and management.      ED Prescriptions   None    PDMP not reviewed this encounter.   Gustavus Bryant, [place] 06/24/22 1209

## 2022-06-25 ENCOUNTER — Ambulatory Visit: Payer: Medicaid Other | Attending: Sports Medicine

## 2022-06-25 ENCOUNTER — Other Ambulatory Visit: Payer: Self-pay

## 2022-06-25 DIAGNOSIS — M5442 Lumbago with sciatica, left side: Secondary | ICD-10-CM | POA: Diagnosis not present

## 2022-06-25 DIAGNOSIS — M5459 Other low back pain: Secondary | ICD-10-CM

## 2022-06-25 DIAGNOSIS — G8929 Other chronic pain: Secondary | ICD-10-CM | POA: Diagnosis not present

## 2022-06-25 DIAGNOSIS — M6281 Muscle weakness (generalized): Secondary | ICD-10-CM | POA: Diagnosis present

## 2022-06-25 DIAGNOSIS — R2689 Other abnormalities of gait and mobility: Secondary | ICD-10-CM | POA: Diagnosis present

## 2022-06-25 NOTE — Therapy (Signed)
OUTPATIENT PHYSICAL THERAPY THORACOLUMBAR EVALUATION   Patient Name: Andrew Turner MRN: [id number] DOB:May 09, 1990, 32 y.o., male Today's Date: 06/25/2022  END OF SESSION:  PT End of Session - 06/25/22 1335     Visit Number 1    Number of Visits 12    Date for PT Re-Evaluation 08/20/22    Authorization Type  MCD    PT Start Time 1222   arrived late   PT Stop Time 1255    PT Time Calculation (min) 33 min    Activity Tolerance No increased pain    Behavior During Therapy WFL for tasks assessed/performed             Past Medical History:  Diagnosis Date   Anxiety    Depression    Irregular heart rhythm    Pneumonia due to COVID-19 virus    Past Surgical History:  Procedure Laterality Date   KNEE SURGERY Left    Patient Active Problem List   Diagnosis Date Noted   Chronic left-sided thoracic back pain 05/21/2022   Moderate episode of recurrent major depressive disorder 05/03/2020   Severe major depression with psychotic features 05/03/2020   Generalized anxiety disorder 05/03/2020    PCP: Ivonne Andrew, NP  REFERRING PROVIDER: Madelyn Brunner, DO  REFERRING DIAG: [phone] (ICD-10-CM) - Chronic bilateral low back pain without sciatica   Rationale for Evaluation and Treatment: Rehabilitation  THERAPY DIAG:  Other low back pain - Plan: PT plan of care cert/re-cert  Muscle weakness (generalized) - Plan: PT plan of care cert/re-cert  Other abnormalities of gait and mobility - Plan: PT plan of care cert/re-cert  ONSET DATE: Chronic  SUBJECTIVE:                                                                                                                                                                                           SUBJECTIVE STATEMENT: Pt presents to PT with reports of chronic lower back pain that is referring into mid back as well. Notes that his pain increased when he was helping his sister move a few months ago. Has numbness in anterior L LE  due to previous knee sx, denies radiating symptoms from lower back. Denies bowel/bladder changes or saddle anesthesia.   PERTINENT HISTORY:  Anxiety/Depression, Hx of L knee surgery  PAIN:  Are you having pain?  Yes: NPRS scale: 7/10 Worst: 9/10 Pain location: lower back Pain description: sharp, tight Aggravating factors: work duties, bending, lifting, prolonged sitting Relieving factors: medication,   PRECAUTIONS: None  WEIGHT BEARING RESTRICTIONS: No  FALLS:  Has patient fallen in last 6 months? No  LIVING ENVIRONMENT: Lives  with: lives with their family Lives in: House/apartment  OCCUPATION: Works at [employer]  PLOF: Independent  PATIENT GOALS: pt wants to decrease back pain and improve flexiblity  OBJECTIVE:   DIAGNOSTIC FINDINGS:  See imaging  PATIENT SURVEYS:  FOTO: 52% function; 57% predicted   COGNITION: Overall cognitive status: Within functional limits for tasks assessed     SENSATION: Light touch: Impaired - L LE  MUSCLE LENGTH: Hamstrings: Right WFL deg; Left WFL deg  POSTURE: rounded shoulders, forward head, and decreased lumbar lordosis  PALPATION: TTP to R lumbar paraspinals  LUMBAR ROM:   AROM eval  Flexion Painful; 50% limited  Extension Painful; very limited  Right lateral flexion   Left lateral flexion   Right rotation   Left rotation    (Blank rows = not tested)  LOWER EXTREMITY MMT:    MMT Right eval Left eval  Hip flexion 4/5 3+/5  Hip extension    Hip abduction 4/5 3+/5  Hip adduction    Hip internal rotation    Hip external rotation    Knee flexion    Knee extension    Ankle dorsiflexion    Ankle plantarflexion    Ankle inversion    Ankle eversion     (Blank rows = not tested)  LUMBAR SPECIAL TESTS:  Straight leg raise test: Negative and Slump test: Positive  FUNCTIONAL TESTS:  30 Second Sit to Stand: 8 reps - with UE  GAIT: Distance walked: 73ft Assistive device utilized: None Level of assistance:  Complete Independence Comments: trunk flexed  TREATMENT: [facility] Adult PT Treatment:                                                DATE: 06/25/2022 Therapeutic Exercise: Supine PPT x 5 - 5" hold LTR x 5 each Seated hamstring stretch x 30" each  PATIENT EDUCATION:  Education details: eval findings, FOTO, HEP, POC Person educated: Patient Education method: Explanation, Demonstration, and Handouts Education comprehension: verbalized understanding and returned demonstration  HOME EXERCISE PROGRAM: Access Code: [email or web address] URL: [email or web address] Date: 06/25/2022 Prepared by: Edwinna Areola  Exercises - Supine Posterior Pelvic Tilt  - 1 x daily - 7 x weekly - 2 sets - 10 reps - 5 sec hold - Supine Lower Trunk Rotation  - 1 x daily - 7 x weekly - 2 sets - 10 reps - Seated Hamstring Stretch  - 1 x daily - 7 x weekly - 2 reps - 30 sec hold  ASSESSMENT:  CLINICAL IMPRESSION: Patient is a 32 y.o. M who was seen today for physical therapy evaluation and treatment for chronic LBP and discomfort. Physical findings are consistent with physician impression as pt demonstrates decrease in strength and lumbar ROM. His FOTO score shows operating subjective functional ability below PLOF. He would benefit from skilled PT services working on improving core strength and functional mobility.   OBJECTIVE IMPAIRMENTS: decreased mobility, difficulty walking, decreased ROM, decreased strength, and pain.   ACTIVITY LIMITATIONS: carrying, lifting, bending, sitting, standing, squatting, and locomotion level  PARTICIPATION LIMITATIONS: driving, shopping, community activity, occupation, and yard work  PERSONAL FACTORS: Time since onset of injury/illness/exacerbation and 1-2 comorbidities: Anxiety/Depression, Hx of L knee surgery  are also affecting patient's functional outcome.   REHAB POTENTIAL: Good  CLINICAL DECISION MAKING: Stable/uncomplicated  EVALUATION COMPLEXITY: Low   GOALS: Goals  reviewed with patient? No  SHORT TERM GOALS: Target date: 07/16/2022   Pt will be compliant and knowledgeable with initial HEP for improved comfort and carryover Baseline: initial HEP given  Goal status: INITIAL  2.  Pt will self report lower back pain no greater than 6/10 for improved comfort and functional ability Baseline: 9/10 at worst Goal status: INITIAL   LONG TERM GOALS: Target date: 08/20/2022   Pt will improve FOTO function score to no less than 57% as proxy for functional improvement with home ADLs and community activites Baseline: 52% function Goal status: INITIAL   2.  Pt will self report lower back pain no greater than 3/10 for improved comfort and functional ability Baseline: 9/10 at worst Goal status: INITIAL   3.  Pt will increase 30 Second Sit to Stand rep count to no less than 10 reps for improved balance, strength, and functional mobility Baseline: 8 reps with UE (MCID 2) Goal status: INITIAL   4.  Pt will be able to squat and lift 20lbs from floor to chest height with no increase in LBP in order to improve comfort with work related duties at [employer] Baseline: unable Goal status: INITIAL  PLAN:  PT FREQUENCY: 1-2x/week  PT DURATION: 8 weeks  PLANNED INTERVENTIONS: Therapeutic exercises, Therapeutic activity, Neuromuscular re-education, Balance training, Gait training, Patient/Family education, Self Care, Joint mobilization, Aquatic Therapy, Dry Needling, Electrical stimulation, Cryotherapy, Moist heat, Manual therapy, and Re-evaluation.  PLAN FOR NEXT SESSION: assess HEP response, core and hip strengthening, improve lumbar ROM   Eloy End, PT 06/25/2022, 1:44 PM

## 2022-06-26 ENCOUNTER — Telehealth: Payer: Self-pay

## 2022-06-26 NOTE — Transitions of Care (Post Inpatient/ED Visit) (Cosign Needed)
   06/26/2022  Name: Abdoulaye Drum MRN: [id number] DOB: 12/19/1990  Today's TOC FU Call Status: Today's TOC FU Call Status:: Unsuccessul Call (1st Attempt) Unsuccessful Call (1st Attempt) Date: 06/26/22  Attempted to reach the patient regarding the most recent Inpatient/ED visit.  Follow Up Plan: Additional outreach attempts will be made to reach the patient to complete the Transitions of Care (Post Inpatient/ED visit) call.   Signature Renelda Loma RMA

## 2022-06-29 ENCOUNTER — Telehealth: Payer: Self-pay

## 2022-06-29 NOTE — Transitions of Care (Post Inpatient/ED Visit) (Cosign Needed)
   06/29/2022  Name: Andrew Turner MRN: [id number] DOB: [birth year]  Today's TOC FU Call Status: Today's TOC FU Call Status:: Successful TOC FU Call Competed TOC FU Call Complete Date: 06/29/22  Transition Care Management Follow-up Telephone Call Discharge Facility: [facility]) Type of Discharge: Emergency Department Reason for ED Visit: Other:, Cardiac Conditions Cardiac Conditions Diagnosis: Chest Pain Persisting How have you been since you were released from the hospital?: Better Any questions or concerns?: No  Items Reviewed: Did you receive and understand the discharge instructions provided?: Yes Medications obtained and verified?: Yes (Medications Reviewed) Any new allergies since your discharge?: No Dietary orders reviewed?: NA Do you have support at home?: Yes People in Home: significant other  Home Care and Equipment/Supplies: Were Home Health Services Ordered?: NA Any new equipment or medical supplies ordered?: NA  Functional Questionnaire: Do you need assistance with bathing/showering or dressing?: No Do you need assistance with meal preparation?: No Do you need assistance with eating?: No Do you have difficulty maintaining continence: No Do you need assistance with getting out of bed/getting out of a chair/moving?: No Do you have difficulty managing or taking your medications?: No  Follow up appointments reviewed: PCP Follow-up appointment confirmed?: Yes Date of PCP follow-up appointment?: 07/06/22 Follow-up Provider: Angus Seller Specialist [facility] Follow-up appointment confirmed?: NA Do you need transportation to your follow-up appointment?: No Do you understand care options if your condition(s) worsen?: Yes-patient verbalized understanding  SDOH Interventions Today    Flowsheet Row Most Recent Value  SDOH Interventions   Food Insecurity Interventions Intervention Not Indicated  Housing Interventions Intervention Not Indicated  Transportation  Interventions Intervention Not Indicated  Utilities Interventions Intervention Not Indicated       SIGNATURE Renelda Loma RMA

## 2022-07-01 ENCOUNTER — Telehealth: Payer: Self-pay | Admitting: Sports Medicine

## 2022-07-01 ENCOUNTER — Other Ambulatory Visit: Payer: Self-pay | Admitting: Sports Medicine

## 2022-07-01 MED ORDER — CYCLOBENZAPRINE HCL 5 MG PO TABS: 5.0000 mg | ORAL_TABLET | Freq: Every day | ORAL | 0 refills | Status: DC

## 2022-07-01 NOTE — Telephone Encounter (Signed)
Patient requesting refill on Cyclobenzaprine and Is requesting more than a 30 day supply please advise

## 2022-07-01 NOTE — Telephone Encounter (Signed)
Refill sent in

## 2022-07-06 ENCOUNTER — Encounter: Payer: Self-pay | Admitting: Nurse Practitioner

## 2022-07-06 ENCOUNTER — Ambulatory Visit (INDEPENDENT_AMBULATORY_CARE_PROVIDER_SITE_OTHER): Payer: Medicaid Other | Admitting: Nurse Practitioner

## 2022-07-06 VITALS — BP 112/76 | HR 73 | Temp 97.8°F | Ht 68.0 in | Wt 190.0 lb

## 2022-07-06 DIAGNOSIS — R000 Tachycardia, unspecified: Secondary | ICD-10-CM

## 2022-07-06 DIAGNOSIS — Z1329 Encounter for screening for other suspected endocrine disorder: Secondary | ICD-10-CM | POA: Diagnosis not present

## 2022-07-06 DIAGNOSIS — K219 Gastro-esophageal reflux disease without esophagitis: Secondary | ICD-10-CM

## 2022-07-06 DIAGNOSIS — Z13220 Encounter for screening for lipoid disorders: Secondary | ICD-10-CM

## 2022-07-06 DIAGNOSIS — R079 Chest pain, unspecified: Secondary | ICD-10-CM

## 2022-07-06 MED ORDER — OMEPRAZOLE 20 MG PO CPDR
20.0000 mg | DELAYED_RELEASE_CAPSULE | Freq: Every day | ORAL | 3 refills | Status: DC
Start: 2022-07-06 — End: 2022-11-23

## 2022-07-06 NOTE — Patient Instructions (Addendum)
1. Thyroid disorder screening  - TSH  2. Lipid screening  - Lipid Panel  3. Racing heart beat  - Ambulatory referral to Cardiology  4. Intermittent chest pain  - Ambulatory referral to Cardiology  5. Gastroesophageal reflux disease without esophagitis  - omeprazole (PRILOSEC) 20 MG capsule; Take 1 capsule (20 mg total) by mouth daily.  Dispense: 30 capsule; Refill: 3   Follow up:  Follow up in 3 months

## 2022-07-06 NOTE — Assessment & Plan Note (Signed)
-   TSH  2. Lipid screening  - Lipid Panel  3. Racing heart beat  - Ambulatory referral to Cardiology  4. Intermittent chest pain  - Ambulatory referral to Cardiology  5. Gastroesophageal reflux disease without esophagitis  - omeprazole (PRILOSEC) 20 MG capsule; Take 1 capsule (20 mg total) by mouth daily.  Dispense: 30 capsule; Refill: 3   Follow up:  Follow up in 3 months

## 2022-07-06 NOTE — Progress Notes (Signed)
@Patient  ID: Andrew Turner, male    DOB: October 25, 1990, 32 y.o.   MRN: [id number]  Chief Complaint  Patient presents with   Hospitalization Follow-up    Per pt he is still having chest pain and hr still up    Referring provider: Ivonne Andrew, NP   HPI  32 year old male with history of chronic left-sided back pain, anxiety, depression with psychotic features.  Patient presents today for an ED follow-up.  Patient was seen in the ED on 06/24/2022 for chest pain. ED workup reassuring. Low risk heart score. EKG and troponins were normal.  Patient states that over the past few weeks he has been having multiple episodes of heart racing and has noticed that this is at rest.  He states that last night his heart rate was in the 120s at home before bedtime when he was laying in the bed.  He is limiting caffeine.  Patient was diagnosed with irregular heart rhythm in 2012.  Patient states that his father recently had triple bypass and his mother has A-fib.  He is concerned about his family heart history.  We discussed that we will refer him to cardiology for this issue.  Patient may need Zio patch for further evaluation.  Heart rate is normal in office today.  Patient states that he has not been feeling anxious or depressed recently.  He does not think that his heart racing is contributed to anxiety attacks.  It was noted that during this office visit patient was constantly belching.  We will start him on omeprazole at bedtime.  This could be a contributing factor to the chest pain.  Patient is due for lipid screening.  We will check thyroid as well today. denies f/c/s, n/v/d, hemoptysis, PND, leg swelling Denies chest pain or edema    No Known Allergies   There is no immunization history on file for this patient.  Past Medical History:  Diagnosis Date   Anxiety    Depression    Irregular heart rhythm    Pneumonia due to COVID-19 virus     Tobacco History: Social History   Tobacco Use  Smoking  Status Former  Smokeless Tobacco Never   Counseling given: Not Answered   Outpatient Encounter Medications as of 07/06/2022  Medication Sig   cyclobenzaprine (FLEXERIL) 5 MG tablet Take 1-2 tablets (5-10 mg total) by mouth at bedtime.   ibuprofen (ADVIL) 400 MG tablet Take 400 mg by mouth every 6 (six) hours as needed.   meloxicam (MOBIC) 15 MG tablet Take 1 tablet (15 mg total) by mouth daily.   omeprazole (PRILOSEC) 20 MG capsule Take 1 capsule (20 mg total) by mouth daily.   No facility-administered encounter medications on file as of 07/06/2022.     Review of Systems  Review of Systems  Constitutional: Negative.   HENT: Negative.    Cardiovascular:  Positive for chest pain.  Gastrointestinal: Negative.   Allergic/Immunologic: Negative.   Neurological: Negative.   Psychiatric/Behavioral: Negative.         Physical Exam  BP 112/76   Pulse 73   Temp 97.8 F (36.6 C)   Ht 5\' 8"  (1.727 m)   Wt 190 lb (86.2 kg)   SpO2 99%   BMI 28.89 kg/m   Wt Readings from Last 5 Encounters:  07/06/22 190 lb (86.2 kg)  06/24/22 189 lb (85.7 kg)  05/21/22 189 lb (85.7 kg)  07/20/21 180 lb (81.6 kg)  07/11/21 180 lb (81.6 kg)  Physical Exam Vitals and nursing note reviewed.  Constitutional:      General: He is not in acute distress.    Appearance: He is well-developed.  Cardiovascular:     Rate and Rhythm: Normal rate and regular rhythm.  Pulmonary:     Effort: Pulmonary effort is normal.     Breath sounds: Normal breath sounds.  Skin:    General: Skin is warm and dry.  Neurological:     Mental Status: He is alert and oriented to person, place, and time.      Lab Results:  CBC    Component Value Date/Time   WBC 9.3 06/24/2022 1304   RBC 4.76 06/24/2022 1304   HGB 13.9 06/24/2022 1304   HCT 40.9 06/24/2022 1304   PLT 307 06/24/2022 1304   MCV 85.9 06/24/2022 1304   MCH 29.2 06/24/2022 1304   MCHC 34.0 06/24/2022 1304   RDW 12.6 06/24/2022 1304   LYMPHSABS  3.0 06/24/2022 1304   MONOABS 0.6 06/24/2022 1304   EOSABS 0.1 06/24/2022 1304   BASOSABS 0.0 06/24/2022 1304    BMET    Component Value Date/Time   NA 139 06/24/2022 1304   K 3.9 06/24/2022 1304   CL 102 06/24/2022 1304   CO2 27 06/24/2022 1304   GLUCOSE 104 (H) 06/24/2022 1304   BUN 14 06/24/2022 1304   CREATININE 0.92 06/24/2022 1304   CALCIUM 9.2 06/24/2022 1304   GFRNONAA >60 06/24/2022 1304    BNP No results found for: "BNP"  ProBNP No results found for: "PROBNP"  Imaging: DG Chest 2 View  Result Date: 06/24/2022 CLINICAL DATA:  Provided history: Chest pain. EXAM: CHEST - 2 VIEW COMPARISON:  Chest radiograph 05/30/2020. FINDINGS: Heart size within normal limits. No appreciable airspace consolidation. No evidence of pleural effusion or pneumothorax. No acute osseous abnormality identified. Degenerative changes of the spine. IMPRESSION: No evidence of active cardiopulmonary disease. Electronically Signed   By: Jackey Loge D.O.   On: 06/24/2022 13:28     Assessment & Plan:   Thyroid disorder screening - TSH  2. Lipid screening  - Lipid Panel  3. Racing heart beat  - Ambulatory referral to Cardiology  4. Intermittent chest pain  - Ambulatory referral to Cardiology  5. Gastroesophageal reflux disease without esophagitis  - omeprazole (PRILOSEC) 20 MG capsule; Take 1 capsule (20 mg total) by mouth daily.  Dispense: 30 capsule; Refill: 3   Follow up:  Follow up in 3 months     Ivonne Andrew, NP 07/06/2022

## 2022-07-07 LAB — TSH: TSH: 3.18 u[IU]/mL (ref 0.450–4.500)

## 2022-07-07 LAB — LIPID PANEL
Chol/HDL Ratio: 8.3 ratio — ABNORMAL HIGH (ref 0.0–5.0)
Cholesterol, Total: 217 mg/dL — ABNORMAL HIGH (ref 100–199)
HDL: 26 mg/dL — ABNORMAL LOW (ref >39)
LDL Chol Calc (NIH): 125 mg/dL — ABNORMAL HIGH (ref 0–99)
Triglycerides: 372 mg/dL — ABNORMAL HIGH (ref 0–149)
VLDL Cholesterol Cal: 66 mg/dL — ABNORMAL HIGH (ref 5–40)

## 2022-07-08 ENCOUNTER — Encounter: Payer: Self-pay | Admitting: Sports Medicine

## 2022-07-08 ENCOUNTER — Other Ambulatory Visit: Payer: Self-pay | Admitting: Nurse Practitioner

## 2022-07-08 MED ORDER — ROSUVASTATIN CALCIUM 10 MG PO TABS: 10.0000 mg | ORAL_TABLET | Freq: Every day | ORAL | 11 refills | Status: DC

## 2022-07-08 NOTE — Progress Notes (Signed)
Pt has seen in my chart. Gh 

## 2022-07-09 ENCOUNTER — Ambulatory Visit (INDEPENDENT_AMBULATORY_CARE_PROVIDER_SITE_OTHER): Payer: Medicaid Other | Admitting: Physician Assistant

## 2022-07-09 ENCOUNTER — Encounter: Payer: Self-pay | Admitting: Physician Assistant

## 2022-07-09 DIAGNOSIS — G8929 Other chronic pain: Secondary | ICD-10-CM | POA: Diagnosis not present

## 2022-07-09 DIAGNOSIS — M546 Pain in thoracic spine: Secondary | ICD-10-CM | POA: Diagnosis not present

## 2022-07-09 NOTE — Progress Notes (Signed)
Office Visit Note   Patient: Andrew Turner           Date of Birth: Apr 25, 1990           MRN: [id number] Visit Date: 07/09/2022              Requested by: Ivonne Andrew, NP [id number] [address] [place],  [place] [address] PCP: Ivonne Andrew, NP  No chief complaint on file.     HPI: Andrew Turner is a 32 year old gentleman who is a patient of Andrew Turner.  He has a long history of low back pain relating problems with his back for over 10 years.  He recently saw Andrew Turner for an evaluation.  It was recommended for him to begin a course of meloxicam and muscle relaxant and physical therapy.  He was given a work note that said he could not lift greater than 20 pounds and may need period of recovery if he has an exacerbation of his back pain.  He has had some exacerbation but no loss of bowel or bladder control just mostly pain.  He states he his work note needs to be more specific with regards to how long he can be off after an exacerbation.  No new injury.  Rates his pain today as moderate but helped with muscle relaxers.  He has tried oral steroids in the past but they have not been particularly helpful and he has some undisclosed GI condition  Assessment & Plan: Visit Diagnoses: Low back pain He does have good strength but does have some positive leg raise going down the left leg.  Will provide the note he needs as well as a note saying he was here today and excuse him from work as he is having pain this morning.  He is going to be starting physical therapy at the mend of the month.  Will follow-up with Andrew Turner.  Obviously if he gets an exacerbation of his symptoms should follow-up with him sooner   Follow-Up Instructions: With Andrew Turner after physical therapy or sooner if symptoms increase such as numbness loss of strength loss of bowel or bladder control should go to nearest emergency room  Ortho Exam  Patient is alert, oriented, no adenopathy, well-dressed, normal affect, normal  respiratory effort. Examination.  GEN appears uncomfortable some muscle spasms in his lower back.  He is alert and oriented no fever he appears well.  He has good dorsiflexion plantarflexion he can sustain a straight leg positive straight leg raise on the left no real symptoms on the right   Imaging: No results found. No images are attached to the encounter.  Labs: No results found for: "HGBA1C", "ESRSEDRATE", "CRP", "LABURIC", "REPTSTATUS", "GRAMSTAIN", "CULT", "LABORGA"   No results found for: "ALBUMIN", "PREALBUMIN", "CBC"  Lab Results  Component Value Date   MG 1.8 06/24/2022   No results found for: "VD25OH"  No results found for: "PREALBUMIN"    Latest Ref Rng & Units 06/24/2022    1:04 PM  CBC EXTENDED  WBC 4.0 - 10.5 K/uL 9.3   RBC 4.22 - 5.81 MIL/uL 4.76   Hemoglobin 13.0 - 17.0 g/dL 21.3   HCT 08.6 - 57.8 % 40.9   Platelets 150 - 400 K/uL 307   NEUT# 1.7 - 7.7 K/uL 5.5   Lymph# 0.7 - 4.0 K/uL 3.0      There is no height or weight on file to calculate BMI.  Orders:  No orders of the defined  types were placed in this encounter.  No orders of the defined types were placed in this encounter.    Procedures: No procedures performed  Clinical Data: No additional findings.  ROS:  All other systems negative, except as noted in the HPI. Review of Systems  Objective: Vital Signs: There were no vitals taken for this visit.  Specialty Comments:  No specialty comments available.  PMFS History: Patient Active Problem List   Diagnosis Date Noted   Thyroid disorder screening 07/06/2022   Chronic left-sided thoracic back pain 05/21/2022   Moderate episode of recurrent major depressive disorder (HCC) 05/03/2020   Severe major depression with psychotic features (HCC) 05/03/2020   Generalized anxiety disorder 05/03/2020   Past Medical History:  Diagnosis Date   Anxiety    Depression    Irregular heart rhythm    Pneumonia due to COVID-19 virus     Family  History  Problem Relation Age of Onset   Atrial fibrillation Mother     Past Surgical History:  Procedure Laterality Date   KNEE SURGERY Left    Social History   Occupational History   Not on file  Tobacco Use   Smoking status: Former   Smokeless tobacco: Never  Vaping Use   Vaping Use: Never used  Substance and Sexual Activity   Alcohol use: Not Currently   Drug use: Not Currently   Sexual activity: Not on file

## 2022-07-10 ENCOUNTER — Encounter: Payer: Self-pay | Admitting: Sports Medicine

## 2022-07-17 NOTE — Therapy (Unsigned)
OUTPATIENT PHYSICAL THERAPY TREATMENT NOTE   Patient Name: Andrew Turner MRN: [id number] DOB:Dec 18, 1990, 32 y.o., male Today's Date: 07/21/2022  PCP: Ivonne Andrew, NP  REFERRING PROVIDER: Madelyn Brunner, DO   END OF SESSION:   PT End of Session - 07/21/22 1347     Visit Number 2    Number of Visits 12    Date for PT Re-Evaluation 09/20/22    Authorization Type [place] [facility]    PT Start Time 1320    PT Stop Time 1400    PT Time Calculation (min) 40 min    Activity Tolerance Patient limited by pain    Behavior During Therapy [facility] for tasks assessed/performed;Anxious             Past Medical History:  Diagnosis Date   Anxiety    Depression    Irregular heart rhythm    Pneumonia due to COVID-19 virus    Past Surgical History:  Procedure Laterality Date   KNEE SURGERY Left    Patient Active Problem List   Diagnosis Date Noted   Thyroid disorder screening 07/06/2022   Chronic left-sided thoracic back pain 05/21/2022   Moderate episode of recurrent major depressive disorder (HCC) 05/03/2020   Severe major depression with psychotic features (HCC) 05/03/2020   Generalized anxiety disorder 05/03/2020    REFERRING DIAG: M54.50,G89.29 (ICD-10-CM) - Chronic bilateral low back pain without sciatica   THERAPY DIAG:  Other low back pain  Muscle weakness (generalized)  Other abnormalities of gait and mobility  Rationale for Evaluation and Treatment Rehabilitation  PERTINENT HISTORY: Anxiety/Depression, Hx of L knee surgery   PRECAUTIONS: None   SUBJECTIVE:                                                                                                                                                                                      SUBJECTIVE STATEMENT:  Describes chronic low back pain 10+ years, unable to cite aggravating or relieving factors.  Denies true radicular pattern and relies on med for pain control.  Has been instructed to return to [facility] for additional sessions  with consideration of MRI if no improvement noted(per patient).  Has not been able to return to PT due to reported scheduling difficulties.   PAIN:  Are you having pain? Yes: NPRS scale: 9/10 Pain location: low back Pain description: ache  Aggravating factors: no distinct aggravating factors Relieving factors: nothing   OBJECTIVE: (objective measures completed at initial evaluation unless otherwise dated)   DIAGNOSTIC FINDINGS:  2 views of the lumbar spine including AP and lateral film were ordered and  reviewed by myself.  X-rays demonstrate no scoliosis.  There is preserved  and intervertebral disc spaces.  There is no acute fracture.  No  antero/retrolisthesis. No significant SI joint sclerosis.    PATIENT SURVEYS:  FOTO: 52% function; 57% predicted  07/21/22 44%   COGNITION: Overall cognitive status: Within functional limits for tasks assessed                          SENSATION: Light touch: Impaired - L LE   MUSCLE LENGTH: Hamstrings: Right WFL deg; Left WFL deg   POSTURE: rounded shoulders, forward head, and decreased lumbar lordosis   PALPATION: TTP to R lumbar paraspinals   LUMBAR ROM:    AROM eval 07/21/22  Flexion Painful; 50% limited 75%P!  Extension Painful; very limited 25% P!  Right lateral flexion     Left lateral flexion     Right rotation     Left rotation      (Blank rows = not tested)   LOWER EXTREMITY MMT:     MMT Right eval Left eval  Hip flexion 4/5 4/5  Hip extension      Hip abduction 4/5 4/5  Hip adduction      Hip internal rotation      Hip external rotation      Knee flexion      Knee extension      Ankle dorsiflexion      Ankle plantarflexion      Ankle inversion      Ankle eversion       (Blank rows = not tested)   LUMBAR SPECIAL TESTS:  Straight leg raise test: Negative and Slump test: Positive 07/21/22 Negative SLR B, negative slump B    FUNCTIONAL TESTS:  30 Second Sit to Stand: 8 reps - with UE 07/21/22 9 w/o UE  support   GAIT: Distance walked: 72ft Assistive device utilized: None Level of assistance: Complete Independence Comments: trunk flexed  07/21/22: 58ft with good posture and cadence.   VITAL SIGNS: 88 BPM 98% O2 sats BP 134/94  TREATMENT: 07/21/22 Re-assessment [facility] Adult PT Treatment:                                                DATE: 06/25/2022 Therapeutic Exercise: Supine PPT x 5 - 5" hold LTR x 5 each Seated hamstring stretch x 30" each   PATIENT EDUCATION:  Education details: eval findings, FOTO, HEP, POC Person educated: Patient Education method: Explanation, Demonstration, and Handouts Education comprehension: verbalized understanding and returned demonstration   HOME EXERCISE PROGRAM: Access Code: [email or web address] URL: [email or web address] Date: 06/25/2022 Prepared by: Edwinna Areola   Exercises - Supine Posterior Pelvic Tilt  - 1 x daily - 7 x weekly - 2 sets - 10 reps - 5 sec hold - Supine Lower Trunk Rotation  - 1 x daily - 7 x weekly - 2 sets - 10 reps - Seated Hamstring Stretch  - 1 x daily - 7 x weekly - 2 reps - 30 sec hold   ASSESSMENT:   CLINICAL IMPRESSION: Patient returns for first f/u session with no change in symptoms.  Initial HEP was too painful to perform and has deferred from stretches.  FOTO scores have declined but 30s chair stand score has improved.  Vitals signs obtained due to hight pain levels but values were WFL.  AROM of trunk has increased.  Updated goal target  dates to reflect extension of POC.   Patient is a 32 y.o. M who was seen today for physical therapy evaluation and treatment for chronic LBP and discomfort. Physical findings are consistent with physician impression as pt demonstrates decrease in strength and lumbar ROM. His FOTO score shows operating subjective functional ability below PLOF. He would benefit from skilled PT services working on improving core strength and functional mobility.    OBJECTIVE IMPAIRMENTS: decreased  mobility, difficulty walking, decreased ROM, decreased strength, and pain.    ACTIVITY LIMITATIONS: carrying, lifting, bending, sitting, standing, squatting, and locomotion level   PARTICIPATION LIMITATIONS: driving, shopping, community activity, occupation, and yard work   PERSONAL FACTORS: Time since onset of injury/illness/exacerbation and 1-2 comorbidities: Anxiety/Depression, Hx of L knee surgery  are also affecting patient's functional outcome.    REHAB POTENTIAL: Good   CLINICAL DECISION MAKING: Stable/uncomplicated   EVALUATION COMPLEXITY: Low     GOALS: Goals reviewed with patient? No   SHORT TERM GOALS: Target date: 08/04/2022       Pt will be compliant and knowledgeable with initial HEP for improved comfort and carryover Baseline: initial HEP given  Goal status: Ongoing   2.  Pt will self report lower back pain no greater than 6/10 for improved comfort and functional ability Baseline: 9/10 at worst Goal status: Ongoing   LONG TERM GOALS: Target date: 08/20/22     Pt will improve FOTO function score to no less than 57% as proxy for functional improvement with home ADLs and community activites Baseline: 52% function Goal status: INITIAL    2.  Pt will self report lower back pain no greater than 3/10 for improved comfort and functional ability Baseline: 9/10 at worst Goal status: INITIAL    3.  Pt will increase 30 Second Sit to Stand rep count to no less than 10 reps for improved balance, strength, and functional mobility Baseline: 8 reps with UE (MCID 2) Goal status: INITIAL    4.  Pt will be able to squat and lift 20lbs from floor to chest height with no increase in LBP in order to improve comfort with work related duties at [employer] Baseline: unable Goal status: INITIAL   PLAN:   PT FREQUENCY: 1-2x/week   PT DURATION: 8 weeks   PLANNED INTERVENTIONS: Therapeutic exercises, Therapeutic activity, Neuromuscular re-education, Balance training, Gait training,  Patient/Family education, Self Care, Joint mobilization, Aquatic Therapy, Dry Needling, Electrical stimulation, Cryotherapy, Moist heat, Manual therapy, and Re-evaluation.   PLAN FOR NEXT SESSION: assess HEP response, core and hip strengthening, improve lumbar ROM   Hildred Laser, PT 07/21/2022, 6:01 PM Check all possible CPT codes: 16109 - PT Re-evaluation, 97110- Therapeutic Exercise, [phone]- Neuro Re-education, [phone] - Gait Training, [phone] - Manual Therapy, [phone] - Therapeutic Activities, and [phone] - Self Care    Check all conditions that are expected to impact treatment: {Conditions expected to impact treatment:None of these apply   If treatment provided at initial evaluation, no treatment charged due to lack of authorization.

## 2022-07-21 ENCOUNTER — Ambulatory Visit: Payer: Medicaid Other | Attending: Sports Medicine

## 2022-07-21 ENCOUNTER — Other Ambulatory Visit: Payer: Self-pay

## 2022-07-21 DIAGNOSIS — R2689 Other abnormalities of gait and mobility: Secondary | ICD-10-CM | POA: Diagnosis present

## 2022-07-21 DIAGNOSIS — M6281 Muscle weakness (generalized): Secondary | ICD-10-CM | POA: Diagnosis present

## 2022-07-21 DIAGNOSIS — M5459 Other low back pain: Secondary | ICD-10-CM | POA: Diagnosis present

## 2022-07-29 ENCOUNTER — Ambulatory Visit (INDEPENDENT_AMBULATORY_CARE_PROVIDER_SITE_OTHER): Payer: Medicaid Other | Admitting: Sports Medicine

## 2022-07-29 DIAGNOSIS — M5441 Lumbago with sciatica, right side: Secondary | ICD-10-CM | POA: Diagnosis not present

## 2022-07-29 DIAGNOSIS — M25552 Pain in left hip: Secondary | ICD-10-CM

## 2022-07-29 DIAGNOSIS — M5442 Lumbago with sciatica, left side: Secondary | ICD-10-CM

## 2022-07-29 DIAGNOSIS — G8929 Other chronic pain: Secondary | ICD-10-CM

## 2022-07-29 DIAGNOSIS — M25551 Pain in right hip: Secondary | ICD-10-CM | POA: Diagnosis not present

## 2022-07-29 NOTE — Progress Notes (Signed)
Turner Turner - 32 y.o. male MRN [id number]  Date of birth: [birth year]  Office Visit Note: Visit Date: 07/29/2022 PCP: Turner Andrew, NP Referred by: Turner Andrew, NP  Subjective: Chief Complaint  Patient presents with   Lower Back - Pain   HPI: Turner Turner is a pleasant 32 y.o. male who presents today for follow-up of bilateral low back pain, also having pain around his lateral hips.   He continues with rather bothersome back pain.  He has been able to get into a few sessions of formalized physical therapy.  Continues with his meloxicam 15 mg as well as taking Flexeril 5 mg nightly as needed.  He initially had been having some relief of his pain, but has been active with his job and last week sat in the car for 3 hours which flared up his pain.  Feels like his pain was exacerbated this weekend.  He did have to take 1 day off of work.  Pain continues across the bilateral low back at times will radiate into the thighs bilaterally, and although more commonly in the left side.  Also having pain over the lateral hips as well worse with prolonged sitting and position change.  Pertinent ROS were reviewed with the patient and found to be negative unless otherwise specified above in HPI.   Assessment & Plan: Visit Diagnoses:  1. Chronic bilateral low back pain with bilateral sciatica   2. Greater trochanteric pain syndrome of both lower extremities    Plan: Turner Turner unfortunately had a recent exacerbation of his chronic bilateral low back pain.  He does have intermittent radicular symptoms that come and go, although more commonly on the left than the right.  He also is having pain over the hips, this does seem to be more over the greater trochanteric region as opposed to radiating pain coming from the back.  He will continue his meloxicam 15 mg once daily and his Flexeril 5-10 mg nightly as needed.  Given the limitation in his pain in his recurrent exacerbations, I do think it is pertinent to obtain an  MRI of the lumbar spine to rule out bulging or herniated disc or other pathology that would explain his pain.  Will give him a note that excused him from work Saturday, note provided today.  Continue his formalized physical therapy, hopefully after the MRI this will give [place] better guidance on his activity restrictions and to help guide his physical therapy management.  Follow-up: Return for 5 days after MRI scan of lumbar spine - patient to call.   Meds & Orders: No orders of the defined types were placed in this encounter.   Orders Placed This Encounter  Procedures   MR Lumbar Spine w/o contrast     Procedures: No procedures performed      Clinical History: No specialty comments available.  He reports that he has quit smoking. He has never used smokeless tobacco. No results for input(s): "HGBA1C", "LABURIC" in the last 8760 hours.  Objective:   Vital Signs: There were no vitals taken for this visit.  Physical Exam  Gen: Well-appearing, in no acute distress; non-toxic CV: Regular Rate. Well-perfused. Warm.  Resp: Breathing unlabored on room air; no wheezing. Psych: Fluid speech in conversation; appropriate affect; normal thought process Neuro: Sensation intact throughout. No gross coordination deficits.   Ortho Exam - Lumbar: There is TTP bilaterally of the paraspinal musculature from the L3-L5 region.  He is also tender to palpation over bilateral SI  joints, left greater than right.  There is restriction in flexion but does worsen his pain, no pain with extension and full range of motion.  Straight leg raise did not produce radicular symptoms but has caused pain pulling in the back.  Positive modified slump's test on the left.  There is 5/5 strength of bilateral lower extremities in the L4-S1 nerve root distribution.   - Bilateral hips:  There is pain upon palpation over bilateral greater trochanters, although hip abduction strength is preserved.  Positive FABER test, full range of  motion with internal and external rotation and logroll testing.  Imaging: No results found.  Past Medical/Family/Surgical/Social History: Medications & Allergies reviewed per EMR, new medications updated. Patient Active Problem List   Diagnosis Date Noted   Thyroid disorder screening 07/06/2022   Chronic left-sided thoracic back pain 05/21/2022   Moderate episode of recurrent major depressive disorder (HCC) 05/03/2020   Severe major depression with psychotic features (HCC) 05/03/2020   Generalized anxiety disorder 05/03/2020   Past Medical History:  Diagnosis Date   Anxiety    Depression    Irregular heart rhythm    Pneumonia due to COVID-19 virus    Family History  Problem Relation Age of Onset   Atrial fibrillation Mother    Past Surgical History:  Procedure Laterality Date   KNEE SURGERY Left    Social History   Occupational History   Not on file  Tobacco Use   Smoking status: Former   Smokeless tobacco: Never  Vaping Use   Vaping Use: Never used  Substance and Sexual Activity   Alcohol use: Not Currently   Drug use: Not Currently   Sexual activity: Not on file

## 2022-07-29 NOTE — Progress Notes (Signed)
Increased back pain; affecting his work

## 2022-08-04 ENCOUNTER — Telehealth: Payer: Self-pay | Admitting: Sports Medicine

## 2022-08-04 ENCOUNTER — Ambulatory Visit: Payer: Medicaid Other | Attending: Sports Medicine

## 2022-08-04 DIAGNOSIS — M6281 Muscle weakness (generalized): Secondary | ICD-10-CM | POA: Diagnosis present

## 2022-08-04 DIAGNOSIS — M5459 Other low back pain: Secondary | ICD-10-CM | POA: Diagnosis present

## 2022-08-04 NOTE — Therapy (Addendum)
OUTPATIENT PHYSICAL THERAPY TREATMENT NOTE/DISCHARGE  PHYSICAL THERAPY DISCHARGE SUMMARY  Visits from Start of Care: 3  Current functional level related to goals / functional outcomes: See goals/objective   Remaining deficits: Unable to assess   Education / Equipment: HEP   Patient agrees to discharge. Patient goals were unable to assess. Patient is being discharged due to not returning since the last visit.     Patient Name: Andrew Turner MRN: [id number] DOB:October 08, 1990, 32 y.o., male Today's Date: 08/04/2022  PCP: Ivonne Andrew, NP  REFERRING PROVIDER: Madelyn Brunner, DO   END OF SESSION:   PT End of Session - 08/04/22 1416     Visit Number 3    Number of Visits 12    Date for PT Re-Evaluation 08/20/22    Authorization Type [place] [facility]    PT Start Time 1416   arrived late   PT Stop Time 1440    PT Time Calculation (min) 24 min    Activity Tolerance Patient limited by pain    Behavior During Therapy [facility] for tasks assessed/performed;Anxious              Past Medical History:  Diagnosis Date   Anxiety    Depression    Irregular heart rhythm    Pneumonia due to COVID-19 virus    Past Surgical History:  Procedure Laterality Date   KNEE SURGERY Left    Patient Active Problem List   Diagnosis Date Noted   Thyroid disorder screening 07/06/2022   Chronic left-sided thoracic back pain 05/21/2022   Moderate episode of recurrent major depressive disorder (HCC) 05/03/2020   Severe major depression with psychotic features (HCC) 05/03/2020   Generalized anxiety disorder 05/03/2020    REFERRING DIAG: M54.50,G89.29 (ICD-10-CM) - Chronic bilateral low back pain without sciatica   THERAPY DIAG:  Other low back pain  Muscle weakness (generalized)  Rationale for Evaluation and Treatment Rehabilitation  PERTINENT HISTORY: Anxiety/Depression, Hx of L knee surgery   PRECAUTIONS: None   SUBJECTIVE:                                                                                                                                                                                       SUBJECTIVE STATEMENT:  Pt presents to PT with reports of continued LBP. Has not been compliant with HEP due to pain.    PAIN:  Are you having pain?  Yes: NPRS scale: 5/10 Pain location: low back Pain description: ache  Aggravating factors: no distinct aggravating factors Relieving factors: nothing   OBJECTIVE: (objective measures completed at initial evaluation unless otherwise dated)   DIAGNOSTIC FINDINGS:  2 views of the lumbar spine including  AP and lateral film were ordered and  reviewed by myself.  X-rays demonstrate no scoliosis.  There is preserved  and intervertebral disc spaces.  There is no acute fracture.  No  antero/retrolisthesis. No significant SI joint sclerosis.    PATIENT SURVEYS:  FOTO: 52% function; 57% predicted  07/21/22 44%   COGNITION: Overall cognitive status: Within functional limits for tasks assessed                          SENSATION: Light touch: Impaired - L LE   MUSCLE LENGTH: Hamstrings: Right WFL deg; Left WFL deg   POSTURE: rounded shoulders, forward head, and decreased lumbar lordosis   PALPATION: TTP to R lumbar paraspinals   LUMBAR ROM:    AROM eval 07/21/22  Flexion Painful; 50% limited 75%P!  Extension Painful; very limited 25% P!  Right lateral flexion     Left lateral flexion     Right rotation     Left rotation      (Blank rows = not tested)   LOWER EXTREMITY MMT:     MMT Right eval Left eval  Hip flexion 4/5 4/5  Hip extension      Hip abduction 4/5 4/5  Hip adduction      Hip internal rotation      Hip external rotation      Knee flexion      Knee extension      Ankle dorsiflexion      Ankle plantarflexion      Ankle inversion      Ankle eversion       (Blank rows = not tested)   LUMBAR SPECIAL TESTS:  Straight leg raise test: Negative and Slump test: Positive 07/21/22 Negative SLR B, negative slump  B    FUNCTIONAL TESTS:  30 Second Sit to Stand: 8 reps - with UE 07/21/22 9 w/o UE support   GAIT: Distance walked: 54ft Assistive device utilized: None Level of assistance: Complete Independence Comments: trunk flexed  07/21/22: 38ft with good posture and cadence.   VITAL SIGNS: 88 BPM 98% O2 sats BP 134/94  TREATMENT: [facility] Adult PT Treatment:                                                DATE: 08/04/2022 Therapeutic Exercise: NuStep lvl 5 UE/LE x 3 min while taking subjective Supine PPT x 10 - 5" hold Supine PPT with ball 2x10 Supine clamshell 2x10 GTB Supine march x 20 GTB LTR x 5 each Seated hamstring stretch x 30" each Paloff press 2x10 7#  07/21/22 Re-assessment  [facility] Adult PT Treatment:                                                DATE: 06/25/2022 Therapeutic Exercise: Supine PPT x 5 - 5" hold LTR x 5 each Seated hamstring stretch x 30" each   PATIENT EDUCATION:  Education details: eval findings, FOTO, HEP, POC Person educated: Patient Education method: Explanation, Demonstration, and Handouts Education comprehension: verbalized understanding and returned demonstration   HOME EXERCISE PROGRAM: Access Code: [email or web address] URL: [email or web address] Date: 06/25/2022 Prepared by: Edwinna Areola   Exercises - Supine Posterior Pelvic Tilt  -  1 x daily - 7 x weekly - 2 sets - 10 reps - 5 sec hold - Supine Lower Trunk Rotation  - 1 x daily - 7 x weekly - 2 sets - 10 reps - Seated Hamstring Stretch  - 1 x daily - 7 x weekly - 2 reps - 30 sec hold   ASSESSMENT:   CLINICAL IMPRESSION: Pt tolerated treatment fair but was limited by pain. Therapy focused on improving core and proximal hip strength. PT encouraged pt to become more compliant with HEP. Will continue per POC.    Patient is a 32 y.o. M who was seen today for physical therapy evaluation and treatment for chronic LBP and discomfort. Physical findings are consistent with physician impression as pt  demonstrates decrease in strength and lumbar ROM. His FOTO score shows operating subjective functional ability below PLOF. He would benefit from skilled PT services working on improving core strength and functional mobility.    OBJECTIVE IMPAIRMENTS: decreased mobility, difficulty walking, decreased ROM, decreased strength, and pain.    ACTIVITY LIMITATIONS: carrying, lifting, bending, sitting, standing, squatting, and locomotion level   PARTICIPATION LIMITATIONS: driving, shopping, community activity, occupation, and yard work   PERSONAL FACTORS: Time since onset of injury/illness/exacerbation and 1-2 comorbidities: Anxiety/Depression, Hx of L knee surgery  are also affecting patient's functional outcome.      GOALS: Goals reviewed with patient? No   SHORT TERM GOALS: Target date: 08/04/2022       Pt will be compliant and knowledgeable with initial HEP for improved comfort and carryover Baseline: initial HEP given  Goal status: Ongoing   2.  Pt will self report lower back pain no greater than 6/10 for improved comfort and functional ability Baseline: 9/10 at worst Goal status: Ongoing   LONG TERM GOALS: Target date: 08/20/22     Pt will improve FOTO function score to no less than 57% as proxy for functional improvement with home ADLs and community activites Baseline: 52% function Goal status: INITIAL    2.  Pt will self report lower back pain no greater than 3/10 for improved comfort and functional ability Baseline: 9/10 at worst Goal status: INITIAL    3.  Pt will increase 30 Second Sit to Stand rep count to no less than 10 reps for improved balance, strength, and functional mobility Baseline: 8 reps with UE (MCID 2) Goal status: INITIAL    4.  Pt will be able to squat and lift 20lbs from floor to chest height with no increase in LBP in order to improve comfort with work related duties at [employer] Baseline: unable Goal status: INITIAL   PLAN:   PT FREQUENCY: 1-2x/week    PT DURATION: 8 weeks   PLANNED INTERVENTIONS: Therapeutic exercises, Therapeutic activity, Neuromuscular re-education, Balance training, Gait training, Patient/Family education, Self Care, Joint mobilization, Aquatic Therapy, Dry Needling, Electrical stimulation, Cryotherapy, Moist heat, Manual therapy, and Re-evaluation.   PLAN FOR NEXT SESSION: assess HEP response, core and hip strengthening, improve lumbar ROM   Eloy End, PT 08/04/2022, 2:43 PM Check all possible CPT codes: 25366 - PT Re-evaluation, 97110- Therapeutic Exercise, [phone]- Neuro Re-education, [phone] - Gait Training, [phone] - Manual Therapy, [phone] - Therapeutic Activities, and [phone] - Self Care    Check all conditions that are expected to impact treatment: {Conditions expected to impact treatment:None of these apply   If treatment provided at initial evaluation, no treatment charged due to lack of authorization.

## 2022-08-04 NOTE — Telephone Encounter (Signed)
Patient requesting Meloxicam sent to [facility] on [address]

## 2022-08-05 ENCOUNTER — Other Ambulatory Visit: Payer: Self-pay | Admitting: Sports Medicine

## 2022-08-05 MED ORDER — MELOXICAM 15 MG PO TABS: 15.0000 mg | ORAL_TABLET | Freq: Every day | ORAL | 1 refills | Status: DC

## 2022-08-05 NOTE — Telephone Encounter (Signed)
Refill sent.

## 2022-08-07 ENCOUNTER — Encounter: Payer: Self-pay | Admitting: Sports Medicine

## 2022-08-13 ENCOUNTER — Ambulatory Visit
Admission: RE | Admit: 2022-08-13 | Discharge: 2022-08-13 | Disposition: A | Discharge status: Home / Self Care | Payer: Medicaid Other | Source: Ambulatory Visit | Attending: Sports Medicine | Admitting: Sports Medicine

## 2022-08-13 ENCOUNTER — Telehealth: Payer: Self-pay

## 2022-08-13 DIAGNOSIS — G8929 Other chronic pain: Secondary | ICD-10-CM

## 2022-08-13 NOTE — Telephone Encounter (Signed)
LVM to schedule pt appt

## 2022-09-17 ENCOUNTER — Ambulatory Visit: Payer: Self-pay | Admitting: Nurse Practitioner

## 2022-10-05 ENCOUNTER — Ambulatory Visit: Payer: Self-pay | Admitting: Nurse Practitioner

## 2022-10-10 ENCOUNTER — Other Ambulatory Visit: Payer: Self-pay | Admitting: Sports Medicine

## 2022-10-11 ENCOUNTER — Other Ambulatory Visit: Payer: Self-pay | Admitting: Sports Medicine

## 2022-10-12 ENCOUNTER — Other Ambulatory Visit: Payer: Self-pay | Admitting: Sports Medicine

## 2022-10-12 ENCOUNTER — Ambulatory Visit (HOSPITAL_COMMUNITY)
Admission: EM | Admit: 2022-10-12 | Discharge: 2022-10-12 | Disposition: A | Discharge status: Home / Self Care | Payer: MEDICAID

## 2022-10-12 ENCOUNTER — Encounter (HOSPITAL_COMMUNITY): Payer: Self-pay

## 2022-10-12 DIAGNOSIS — M25552 Pain in left hip: Secondary | ICD-10-CM

## 2022-10-12 MED ORDER — PREDNISONE 10 MG PO TABS: 40.0000 mg | ORAL_TABLET | Freq: Every day | ORAL | 0 refills | Status: AC

## 2022-10-12 MED ORDER — MELOXICAM 15 MG PO TABS: 15.0000 mg | ORAL_TABLET | Freq: Every day | ORAL | 1 refills | Status: DC

## 2022-10-12 MED ORDER — CYCLOBENZAPRINE HCL 5 MG PO TABS: 5.0000 mg | ORAL_TABLET | Freq: Every day | ORAL | 0 refills | Status: DC

## 2022-10-12 NOTE — Discharge Instructions (Signed)
Take prednisone as prescribed Avoid NSAIDS like ibuprofen and meloxicam while taking prednisone, you can take Tylenol Continue with Flexeril as needed Recommend ice to affected area, rest Follow up with orthopedics

## 2022-10-12 NOTE — ED Triage Notes (Signed)
Left Hip Pain x3 days. Patient recently started back working after 2 months. Onset Saturday with spontaneous left hip pain. States the left knee also buckled on him. Has history pf 2 surgeries on that knee, no acl, and bone spurs on that same knee.  Patient has history of bulging disc and spinal stenosis.

## 2022-10-12 NOTE — ED Provider Notes (Signed)
[facility]    CSN: [id number] Arrival date & time: 10/12/22  1807      History   Chief Complaint Chief Complaint  Patient presents with   Hip Pain    HPI Andrew Turner is a 32 y.o. male.   Patient complains of left hip pain that started 3 days ago.  Denies injury or trauma.  He does have known lower back problems and has experienced left hip pain associated with this in the past.  He denies back pain at this time.  He reports pain has become worse since he returned to work.  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  He also has problems with his knees history of surgery on the left knee.  He has been taking Flexeril with temporary relief.  He has also been taking meloxicam of ibuprofen with minimal relief.  He reports some radiation of pain into his thigh.  Denies numbness, tingling, saddle anesthesia.  He does describe an episode where the left knee buckled while standing at work.  He has reached out to orthopedics for an appointment, insurance is currently pending until 1 September.  He    Past Medical History:  Diagnosis Date   Anxiety    Depression    Irregular heart rhythm    Pneumonia due to COVID-19 virus     Patient Active Problem List   Diagnosis Date Noted   Thyroid disorder screening 07/06/2022   Chronic left-sided thoracic back pain 05/21/2022   Moderate episode of recurrent major depressive disorder (HCC) 05/03/2020   Severe major depression with psychotic features (HCC) 05/03/2020   Generalized anxiety disorder 05/03/2020    Past Surgical History:  Procedure Laterality Date   KNEE SURGERY Left        Home Medications    Prior to Admission medications   Medication Sig Start Date End Date Taking? Authorizing Provider  cyclobenzaprine (FLEXERIL) 5 MG tablet Take 1-2 tablets (5-10 mg total) by mouth at bedtime. 10/12/22  Yes Madelyn Brunner, DO  ibuprofen (ADVIL) 400 MG tablet Take 400 mg by mouth every 6 (six) hours as needed.   Yes [provider]  meloxicam (MOBIC) 15 MG tablet Take 1 tablet (15 mg total) by mouth daily. 10/12/22  Yes Madelyn Brunner, DO  NAPROXEN PO Take by mouth.   Yes [provider]  predniSONE (DELTASONE) 10 MG tablet Take 4 tablets (40 mg total) by mouth daily for 5 days. 10/12/22 10/17/22 Yes Ward, Tylene Fantasia, PA-C  omeprazole (PRILOSEC) 20 MG capsule Take 1 capsule (20 mg total) by mouth daily. 07/06/22   Ivonne Andrew, NP  rosuvastatin (CRESTOR) 10 MG tablet Take 1 tablet (10 mg total) by mouth daily. 07/08/22 07/08/23  Ivonne Andrew, NP    Family History Family History  Problem Relation Age of Onset   Atrial fibrillation Mother     Social History Social History   Tobacco Use   Smoking status: Former   Smokeless tobacco: Never  Vaping Use   Vaping status: Never Used  Substance Use Topics   Alcohol use: Not Currently   Drug use: Not Currently     Allergies   Patient has no known allergies.   Review of Systems Review of Systems  Constitutional:  Negative for chills and fever.  HENT:  Negative for ear pain and sore throat.   Eyes:  Negative for pain and visual disturbance.  Respiratory:  Negative for cough and shortness of breath.  Cardiovascular:  Negative for chest pain and palpitations.  Gastrointestinal:  Negative for abdominal pain and vomiting.  Genitourinary:  Negative for dysuria and hematuria.  Musculoskeletal:  Positive for arthralgias. Negative for back pain.  Skin:  Negative for color change and rash.  Neurological:  Negative for seizures and syncope.  All other systems reviewed and are negative.    Physical Exam Triage Vital Signs ED Triage Vitals  Encounter Vitals Group     BP 10/12/22 1839 124/81     Systolic BP Percentile --      Diastolic BP Percentile --      Pulse Rate 10/12/22 1839 91     Resp 10/12/22 1839 18     Temp 10/12/22 1839 98.2 F (36.8 C)     Temp Source 10/12/22 1839 Oral     SpO2 10/12/22 1839 96 %     Weight 10/12/22 1838  190 lb (86.2 kg)     Height 10/12/22 1838 5\' 8"  (1.727 m)     Head Circumference --      Peak Flow --      Pain Score 10/12/22 1836 8     Pain Loc --      Pain Education --      Exclude from Growth Chart --    No data found.  Updated Vital Signs BP 124/81 (BP Location: Left Arm)   Pulse 91   Temp 98.2 F (36.8 C) (Oral)   Resp 18   Ht 5\' 8"  (1.727 m)   Wt 190 lb (86.2 kg)   SpO2 96%   BMI 28.89 kg/m   Visual Acuity Right Eye Distance:   Left Eye Distance:   Bilateral Distance:    Right Eye Near:   Left Eye Near:    Bilateral Near:     Physical Exam Vitals and nursing note reviewed.  Constitutional:      General: He is not in acute distress.    Appearance: He is well-developed.  HENT:     Head: Normocephalic and atraumatic.  Eyes:     Conjunctiva/sclera: Conjunctivae normal.  Cardiovascular:     Rate and Rhythm: Normal rate and regular rhythm.     Heart sounds: No murmur heard. Pulmonary:     Effort: Pulmonary effort is normal. No respiratory distress.     Breath sounds: Normal breath sounds.  Abdominal:     Palpations: Abdomen is soft.     Tenderness: There is no abdominal tenderness.  Musculoskeletal:        General: No swelling.     Cervical back: Neck supple.     Comments: Tenderness to palpation over left greater troches.  Skin:    General: Skin is warm and dry.     Capillary Refill: Capillary refill takes less than 2 seconds.  Neurological:     Mental Status: He is alert.  Psychiatric:        Mood and Affect: Mood normal.      UC Treatments / Results  Labs (all labs ordered are listed, but only abnormal results are displayed) Labs Reviewed - No data to display  EKG   Radiology No results found.  Procedures Procedures (including critical care time)  Medications Ordered in UC Medications - No data to display  Initial Impression / Assessment and Plan / UC Course  I have reviewed the triage vital signs and the nursing  notes.  Pertinent labs & imaging results that were available during my care of the patient were reviewed by  me and considered in my medical decision making (see chart for details).     Left hip pain.  Greater trochanter bursitis versus radiculopathy.  Without recent injury or trauma x-ray deferred at this time.  Will prescribe course of prednisone advised to continue Flexeril, supportive care discussed.  Advised to reach out to orthopedic office to make an appointment.  No red flag signs at this time. Final Clinical Impressions(s) / UC Diagnoses   Final diagnoses:  Left hip pain     Discharge Instructions      Take prednisone as prescribed Avoid NSAIDS like ibuprofen and meloxicam while taking prednisone, you can take Tylenol Continue with Flexeril as needed Recommend ice to affected area, rest Follow up with orthopedics    ED Prescriptions     Medication Sig Dispense Auth. Provider   predniSONE (DELTASONE) 10 MG tablet Take 4 tablets (40 mg total) by mouth daily for 5 days. 20 tablet Ward, Tylene Fantasia, PA-C      PDMP not reviewed this encounter.   Ward, Tylene Fantasia, PA-C 10/12/22 1911

## 2022-11-10 ENCOUNTER — Other Ambulatory Visit: Payer: Self-pay | Admitting: Sports Medicine

## 2022-11-10 MED ORDER — CYCLOBENZAPRINE HCL 5 MG PO TABS: 5.0000 mg | ORAL_TABLET | Freq: Every day | ORAL | 0 refills | Status: DC

## 2022-11-18 ENCOUNTER — Telehealth: Payer: Self-pay | Admitting: Physical Medicine and Rehabilitation

## 2022-11-18 ENCOUNTER — Ambulatory Visit: Payer: MEDICAID | Admitting: Physical Medicine and Rehabilitation

## 2022-11-18 ENCOUNTER — Encounter: Payer: Self-pay | Admitting: Physical Medicine and Rehabilitation

## 2022-11-18 DIAGNOSIS — M542 Cervicalgia: Secondary | ICD-10-CM | POA: Diagnosis not present

## 2022-11-18 DIAGNOSIS — M7918 Myalgia, other site: Secondary | ICD-10-CM | POA: Diagnosis not present

## 2022-11-18 NOTE — Progress Notes (Signed)
Functional Pain Scale - descriptive words and definitions   Severe (9)  Cannot do any ADL's even with assistance can barely talk/unable to sleep and unable to use distraction. Severe range order Hurts on right side of neck and "pulls" when the patient turns neck. Very stiff upon waking up. Pain has been intermittent for the last 2 months.  Average Pain 8

## 2022-11-18 NOTE — Progress Notes (Signed)
Andrew Turner - 32 y.o. male MRN [id number]  Date of birth: November 05, 1990  Office Visit Note: Visit Date: 11/18/2022 PCP: Ivonne Andrew, NP Referred by: Ivonne Andrew, NP  Subjective: No chief complaint on file.  HPI: Andrew Turner is a 32 y.o. male who comes in today as a self referral for evaluation of chronic, worsening and severe bilateral neck pain radiating up to head. We are happy to see patient today, however he is being followed by our partner Dr. Madelyn Brunner. Pain ongoing intermittently for 2 months, worsens with movement and activity, he describes as sore and shooting sensation, currently rates as 5 out of 10. Some relief of pain with home exercise regimen, rest and use of medications. No prior imaging of cervical spine. No history of cervical surgery/injections. Patient currently working as Conservation officer, nature at [employer]. Patient denies focal weakness, numbness and tingling. No recent trauma or falls.   Of note, he is also currently being treated by Dr. Shon Baton for lower back pain. He reports issues getting scheduled for physical therapy. He does have history of depression and anxiety.    Review of Systems  Musculoskeletal:  Positive for myalgias and neck pain.  Neurological:  Negative for tingling, sensory change, focal weakness and weakness.  All other systems reviewed and are negative.  Otherwise per HPI.  Assessment & Plan: Visit Diagnoses:    ICD-10-CM   1. Cervicalgia  M54.2 Ambulatory referral to Physical Therapy    2. Myofascial pain syndrome  M79.18 Ambulatory referral to Physical Therapy       Plan: Findings:  Chronic, worsening and severe bilateral neck pain, intermittent radiation of pain to head. Patient continues to have severe pain despite good conservative therapies such as home exercise regimen, rest and use of medications. Patients clinical presentation and exam are consistent with myofascial pain syndrome. Tenderness noted to bilateral levator scapula and trapezius  regions upon exam today. We discussed treatment plan in detail today. I will place new order for regimen of formal physical therapy/dry needling for his neck. He did voice issues regarding scheduling physical therapy, financial issues and problems with his car. He would like to try PT at another facility. I will speak with Shayne Alken and see if we can try a different practice. I also discussed medication management with him today, would recommend trial of Cymbalta as this medication can be helpful with myofascial pain, however he does not wish to try any new medications. He has no questions at this time. Patient instructed to follow up with Dr. Shon Baton as needed. No red flag symptoms noted upon exam today.     Meds & Orders: No orders of the defined types were placed in this encounter.   Orders Placed This Encounter  Procedures   Ambulatory referral to Physical Therapy    Follow-up: Return for follow up wtih Dr. Shon Baton.   Procedures: No procedures performed      Clinical History: No specialty comments available.   He reports that he has quit smoking. He has never used smokeless tobacco. No results for input(s): "HGBA1C", "LABURIC" in the last 8760 hours.  Objective:  VS:  HT:    WT:   BMI:     BP:   HR: bpm  TEMP: ( )  RESP:  Physical Exam Vitals and nursing note reviewed.  HENT:     Head: Normocephalic and atraumatic.     Right Ear: External ear normal.     Left Ear: External ear normal.  Nose: Nose normal.     Mouth/Throat:     Mouth: Mucous membranes are moist.  Eyes:     Extraocular Movements: Extraocular movements intact.  Cardiovascular:     Rate and Rhythm: Normal rate.     Pulses: Normal pulses.  Pulmonary:     Effort: Pulmonary effort is normal.  Abdominal:     General: Abdomen is flat. There is no distension.  Musculoskeletal:        General: Tenderness present.     Cervical back: Tenderness present.     Comments: Discomfort noted with flexion, extension and  side-to-side rotation. Patient has good strength in the upper extremities including 5 out of 5 strength in wrist extension, long finger flexion and APB. Shoulder range of motion is full bilaterally without any sign of impingement. There is no atrophy of the hands intrinsically. Sensation intact bilaterally. Tenderness noted upon palpation of bilateral levator scapulae and trapezius regions. Negative Hoffman's sign. Negative Spurling's sign.     Skin:    General: Skin is warm and dry.     Capillary Refill: Capillary refill takes less than 2 seconds.  Neurological:     General: No focal deficit present.     Mental Status: He is alert and oriented to person, place, and time.  Psychiatric:        Mood and Affect: Mood normal.        Behavior: Behavior normal.     Ortho Exam  Imaging: No results found.  Past Medical/Family/Surgical/Social History: Medications & Allergies reviewed per EMR, new medications updated. Patient Active Problem List   Diagnosis Date Noted   Thyroid disorder screening 07/06/2022   Chronic left-sided thoracic back pain 05/21/2022   Moderate episode of recurrent major depressive disorder (HCC) 05/03/2020   Severe major depression with psychotic features (HCC) 05/03/2020   Generalized anxiety disorder 05/03/2020   Past Medical History:  Diagnosis Date   Anxiety    Depression    Irregular heart rhythm    Pneumonia due to COVID-19 virus    Family History  Problem Relation Age of Onset   Atrial fibrillation Mother    Past Surgical History:  Procedure Laterality Date   KNEE SURGERY Left    Social History   Occupational History   Not on file  Tobacco Use   Smoking status: Former   Smokeless tobacco: Never  Vaping Use   Vaping status: Never Used  Substance and Sexual Activity   Alcohol use: Not Currently   Drug use: Not Currently   Sexual activity: Yes

## 2022-11-18 NOTE — Telephone Encounter (Signed)
Patient would like to go to [facility] for PT. Fax [phone] on [address]

## 2023-01-07 ENCOUNTER — Ambulatory Visit
Admission: EM | Admit: 2023-01-07 | Discharge: 2023-01-07 | Disposition: A | Discharge status: Home / Self Care | Payer: MEDICAID | Attending: Internal Medicine | Admitting: Internal Medicine

## 2023-01-07 DIAGNOSIS — M25562 Pain in left knee: Secondary | ICD-10-CM | POA: Diagnosis not present

## 2023-01-07 MED ORDER — PREDNISONE 20 MG PO TABS: 40.0000 mg | ORAL_TABLET | Freq: Every day | ORAL | 0 refills | Status: AC

## 2023-01-07 MED ORDER — KETOROLAC TROMETHAMINE 30 MG/ML IJ SOLN
30.0000 mg | Freq: Once | INTRAMUSCULAR | Status: AC
  Administered 2023-01-07: 30 mg via INTRAMUSCULAR

## 2023-01-07 NOTE — ED Triage Notes (Signed)
Patient presents with left knee pain x day 4. States it has been popping and swelling. States back of knee hurts to the touch. Treated with Meloxicam and muscle relaxer with little relief.

## 2023-01-07 NOTE — Discharge Instructions (Addendum)
You were given a shot today for pain.  Do not take any ibuprofen, Advil, Aleve, meloxicam for at least 24 hours following injection.  I have also prescribed you prednisone to decrease inflammation associated with your knee pain.  Please wear knee brace and apply ice.  Follow-up with orthopedist for further evaluation and management.

## 2023-01-07 NOTE — ED Provider Notes (Signed)
[facility]    CSN: [id number] Arrival date & time: 01/07/23  1804      History   Chief Complaint No chief complaint on file.   HPI Andrew Turner is a 32 y.o. male.   Patient presents with left knee pain that started about 4 days ago.  Reports that he has a history of chronic left knee pain that flares up intermittently.  Although, he reports it has not flared up in approximately 1 year.  He does have a previous injury to that knee where he has had surgery.  He last saw orthopedist approximately 1 year ago.  He has been taking meloxicam and a muscle relaxer that he takes for his back with minimal improvement in knee pain.  Denies any associated fever or any recent injury.  He does prolonged standing at work place.     Past Medical History:  Diagnosis Date   Anxiety    Depression    Irregular heart rhythm    Pneumonia due to COVID-19 virus     Patient Active Problem List   Diagnosis Date Noted   Thyroid disorder screening 07/06/2022   Chronic left-sided thoracic back pain 05/21/2022   Moderate episode of recurrent major depressive disorder (HCC) 05/03/2020   Severe major depression with psychotic features (HCC) 05/03/2020   Generalized anxiety disorder 05/03/2020    Past Surgical History:  Procedure Laterality Date   KNEE SURGERY Left        Home Medications    Prior to Admission medications   Medication Sig Start Date End Date Taking? Authorizing Provider  predniSONE (DELTASONE) 20 MG tablet Take 2 tablets (40 mg total) by mouth daily for 5 days. 01/07/23 01/12/23 Yes Juquan Reznick, Acie Fredrickson, FNP  cyclobenzaprine (FLEXERIL) 5 MG tablet Take 1-2 tablets (5-10 mg total) by mouth at bedtime. 11/10/22   Madelyn Brunner, DO  ibuprofen (ADVIL) 400 MG tablet Take 400 mg by mouth every 6 (six) hours as needed.    [provider]  meloxicam (MOBIC) 15 MG tablet Take 1 tablet (15 mg total) by mouth daily. 10/12/22   Madelyn Brunner, DO  NAPROXEN PO Take by mouth.     [provider]    Family History Family History  Problem Relation Age of Onset   Atrial fibrillation Mother     Social History Social History   Tobacco Use   Smoking status: Former   Smokeless tobacco: Never  Vaping Use   Vaping status: Never Used  Substance Use Topics   Alcohol use: Not Currently   Drug use: Not Currently     Allergies   Patient has no known allergies.   Review of Systems Review of Systems Per HPI  Physical Exam Triage Vital Signs ED Triage Vitals  Encounter Vitals Group     BP 01/07/23 1814 122/84     Systolic BP Percentile --      Diastolic BP Percentile --      Pulse Rate 01/07/23 1814 98     Resp 01/07/23 1814 18     Temp 01/07/23 1814 98.4 F (36.9 C)     Temp Source 01/07/23 1814 Oral     SpO2 01/07/23 1814 100 %     Weight 01/07/23 1813 185 lb (83.9 kg)     Height 01/07/23 1813 5\' 8"  (1.727 m)     Head Circumference --      Peak Flow --      Pain Score 01/07/23 1813 10  Pain Loc --      Pain Education --      Exclude from Growth Chart --    No data found.  Updated Vital Signs BP 122/84 (BP Location: Left Arm)   Pulse 98   Temp 98.4 F (36.9 C) (Oral)   Resp 18   Ht 5\' 8"  (1.727 m)   Wt 185 lb (83.9 kg)   SpO2 100%   BMI 28.13 kg/m   Visual Acuity Right Eye Distance:   Left Eye Distance:   Bilateral Distance:    Right Eye Near:   Left Eye Near:    Bilateral Near:     Physical Exam Constitutional:      General: He is not in acute distress.    Appearance: Normal appearance. He is not toxic-appearing or diaphoretic.  HENT:     Head: Normocephalic and atraumatic.  Eyes:     Extraocular Movements: Extraocular movements intact.     Conjunctiva/sclera: Conjunctivae normal.  Pulmonary:     Effort: Pulmonary effort is normal.  Musculoskeletal:     Comments: Tenderness to palpation to bilateral lateral anterior left knee as well as lower anterior knee.  No significant swelling or discoloration noted.   No crepitus noted.  Full range of motion knee present.  Capillary refill and pulses intact.  Neurological:     General: No focal deficit present.     Mental Status: He is alert and oriented to person, place, and time. Mental status is at baseline.  Psychiatric:        Mood and Affect: Mood normal.        Behavior: Behavior normal.        Thought Content: Thought content normal.        Judgment: Judgment normal.      UC Treatments / Results  Labs (all labs ordered are listed, but only abnormal results are displayed) Labs Reviewed - No data to display  EKG   Radiology No results found.  Procedures Procedures (including critical care time)  Medications Ordered in UC Medications  ketorolac (TORADOL) 30 MG/ML injection 30 mg (30 mg Intramuscular Given 01/07/23 1843)    Initial Impression / Assessment and Plan / UC Course  I have reviewed the triage vital signs and the nursing notes.  Pertinent labs & imaging results that were available during my care of the patient were reviewed by me and considered in my medical decision making (see chart for details).     Given chronicity of issue and no direct injury, imaging was deferred.  Advised patient to follow-up with established orthopedist for further evaluation and management.  Patient has a knee brace at home so encouraged him to use this.  Also advised ice application.  Given NSAIDs have not been helpful, will treat with prednisone.  Patient last took prednisone a few months ago and typically tolerates well so this should be safe.  No obvious contraindication to steroid therapy noted in patient's history.  IM Toradol also administered today as patient denies that he has had any NSAIDs today.  Advised no NSAIDs for at least 24 hours following injection.  Patient verbalized understanding and was agreeable with plan. Final Clinical Impressions(s) / UC Diagnoses   Final diagnoses:  Acute pain of left knee     Discharge Instructions       You were given a shot today for pain.  Do not take any ibuprofen, Advil, Aleve, meloxicam for at least 24 hours following injection.  I have also  prescribed you prednisone to decrease inflammation associated with your knee pain.  Please wear knee brace and apply ice.  Follow-up with orthopedist for further evaluation and management.    ED Prescriptions     Medication Sig Dispense Auth. Provider   predniSONE (DELTASONE) 20 MG tablet Take 2 tablets (40 mg total) by mouth daily for 5 days. 10 tablet Gustavus Bryant, [place]      PDMP not reviewed this encounter.   Gustavus Bryant, [place] 01/07/23 [phone]

## 2023-01-08 ENCOUNTER — Ambulatory Visit: Payer: MEDICAID

## 2023-01-19 ENCOUNTER — Ambulatory Visit: Payer: MEDICAID | Admitting: Sports Medicine

## 2023-01-19 ENCOUNTER — Other Ambulatory Visit (INDEPENDENT_AMBULATORY_CARE_PROVIDER_SITE_OTHER): Payer: MEDICAID

## 2023-01-19 ENCOUNTER — Encounter: Payer: Self-pay | Admitting: Sports Medicine

## 2023-01-19 DIAGNOSIS — S83512S Sprain of anterior cruciate ligament of left knee, sequela: Secondary | ICD-10-CM

## 2023-01-19 DIAGNOSIS — M25562 Pain in left knee: Secondary | ICD-10-CM

## 2023-01-19 DIAGNOSIS — M1712 Unilateral primary osteoarthritis, left knee: Secondary | ICD-10-CM | POA: Diagnosis not present

## 2023-01-19 DIAGNOSIS — G8929 Other chronic pain: Secondary | ICD-10-CM

## 2023-01-19 MED ORDER — MELOXICAM 15 MG PO TABS: 15.0000 mg | ORAL_TABLET | Freq: Every day | ORAL | 1 refills | Status: DC

## 2023-01-19 MED ORDER — KETOROLAC TROMETHAMINE 30 MG/ML IJ SOLN
60.0000 mg | INTRAMUSCULAR | Status: AC | PRN
Start: 2023-01-19 — End: 2023-01-19
  Administered 2023-01-19: 60 mg via INTRA_ARTICULAR

## 2023-01-19 MED ORDER — LIDOCAINE HCL 1 % IJ SOLN
2.0000 mL | INTRAMUSCULAR | Status: AC | PRN
  Administered 2023-01-19: 2 mL

## 2023-01-19 MED ORDER — BUPIVACAINE HCL 0.25 % IJ SOLN
2.0000 mL | INTRAMUSCULAR | Status: AC | PRN
Start: 2023-01-19 — End: 2023-01-19
  Administered 2023-01-19: 2 mL via INTRA_ARTICULAR

## 2023-01-19 NOTE — Progress Notes (Signed)
Turner Turner - 32 y.o. male MRN [id number]  Date of birth: Jul 14, 1990  Office Visit Note: Visit Date: 01/19/2023 PCP: Turner Andrew, NP Referred by: Turner Andrew, NP  Subjective: Chief Complaint  Patient presents with   Left Knee - Pain   HPI: Turner Turner is a pleasant 32 y.o. male who presents today for acute on chronic left knee pain.  Turner Turner has been dealing with knee pain chronically, although his pain and swelling has certainly worsened and become exacerbated over the last few weeks.  He did start a new job in October when he is on his feet much more often.  He does wear his knee brace at work.  He has a notable injury and surgical history for the knee, as below.  Notable history for left knee: Had ACL, MCL, meniscus tear in 2007 --> no surgery to correct until May of 2011. Reports he retore his ACL in Dec 2011, with subsequent ACL repair again. Then reports he likely tore it again in 2014, but has not had surgery to correct this. Does report he has an MRI from [facility] back in 2023 which shows no intact ACL, osteoarthritis as well as bone spurs and a partial meniscal tear.  Pertinent ROS were reviewed with the patient and found to be negative unless otherwise specified above in HPI.   Assessment & Plan: Visit Diagnoses:  1. Unilateral primary osteoarthritis, left knee   2. Chronic pain of left knee   3. 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  He does have a small effusion in the knee joint as well as knee instability.  I discussed with him at this point I would like him to see my surgical partner, Dr. Steward Turner, to see if an ACL reconstruction would be something that would benefit him given his young age. [name] is aware  that he has rather significant osteoarthritis from his ACL insufficiency at such a young age.  He will obtain his MRI disc from [facility] and bring that to his appointment with Dr. Steward Turner.  In the meantime, we do not want to place corticosteroid in the knee in case a surgery would be pursued in the next few months, for his pain and swelling we did proceed with Toradol injection intra-articularly into the knee joint.  Will refill his meloxicam 15 mg to be taken once daily.  May use ice, his knee brace and Tylenol for related pain. Make appt with Dr. Steward Turner.  Follow-up: Return for Make appt with Dr. Steward Turner in about 3 weeks for Left knee OA, chronic ACL tear.   Meds & Orders:  Meds ordered this encounter  Medications   meloxicam (MOBIC) 15 MG tablet    Sig: Take 1 tablet (15 mg total) by mouth daily.    Dispense:  30 tablet    Refill:  1    Orders Placed This Encounter  Procedures   Large Joint Inj: L knee   XR Knee Complete 4 Views Left     Procedures: Large Joint Inj: L knee on 01/19/2023 2:32 PM Indications: pain and joint swelling Details: 22 G 1.5 in needle, anterolateral approach Medications: 2 mL lidocaine 1 %; 2 mL bupivacaine 0.25 %; 60 mg ketorolac 30 MG/ML Outcome: tolerated well,  no immediate complications  Knee Injection, Left: After discussion on risks/benefits/indications, informed verbal consent was obtained and a timeout was performed, patient was seated on exam table. The patient's knee was prepped with Betadine and alcohol swab and utilizing anterolateral approach, the patient's knee was injected intraarticularly with 2:2:2 lidocaine 1%:bupivicaine 0.25%:ketorolac (Toradol 30mg /mL). Patient tolerated the procedure well without immediate complications.  Procedure, treatment alternatives, risks and benefits explained, specific risks discussed. Consent was given by the patient. Immediately prior to procedure a time out was called to verify the correct patient, procedure,  equipment, support staff and site/side marked as required. Patient was prepped and draped in the usual sterile fashion.          Clinical History: No specialty comments available.  He reports that he has quit smoking. He has never used smokeless tobacco. No results for input(s): "HGBA1C", "LABURIC" in the last 8760 hours.  Objective:   Physical Exam  Gen: Well-appearing, in no acute distress; non-toxic CV: Well-perfused. Warm.  Resp: Breathing unlabored on room air; no wheezing. Psych: Fluid speech in conversation; appropriate affect; normal thought process Neuro: Sensation intact throughout. No gross coordination deficits.   Ortho Exam - Left knee: Inspection demonstrates a small to moderate effusion of the left knee.  There is medial and lateral joint line TTP.  There is clear ACL insufficiency with positive Lachman, positive anterior drawer compared to the contralateral knee.   Imaging: XR Knee Complete 4 Views Left  Result Date: 01/19/2023 4 views of the left knee including standing AP, Rosenberg, lateral and sunrise view were ordered and reviewed by myself.  X-rays demonstrate moderate tibiofemoral arthritic change with tibial subluxation falling into a valgus fashion.  There is medial femoral condyle spurring.  On the lateral view there is proximal tibial for displacement likely from underlying ACL rupture or insufficiency.  Small joint effusion noted.  There is surgical clips in the lateral femoral condyle from prior ACL surgery.  No acute fracture.   Past Medical/Family/Surgical/Social History: Medications & Allergies reviewed per EMR, new medications updated. Patient Active Problem List   Diagnosis Date Noted   Thyroid disorder screening 07/06/2022   Chronic left-sided thoracic back pain 05/21/2022   Moderate episode of recurrent major depressive disorder (HCC) 05/03/2020   Severe major depression with psychotic features (HCC) 05/03/2020   Generalized anxiety disorder  05/03/2020   Past Medical History:  Diagnosis Date   Anxiety    Depression    Irregular heart rhythm    Pneumonia due to COVID-19 virus    Family History  Problem Relation Age of Onset   Atrial fibrillation Mother    Past Surgical History:  Procedure Laterality Date   KNEE SURGERY Left    Social History   Occupational History   Not on file  Tobacco Use   Smoking status: Former   Smokeless tobacco: Never  Vaping Use   Vaping status: Never Used  Substance and Sexual Activity   Alcohol use: Not Currently   Drug use: Not Currently   Sexual activity: Yes

## 2023-01-19 NOTE — Progress Notes (Signed)
Patient says that he has had swelling and popping in the knee for the last 3 weeks. He says that he started a new job in October where he is on his feet all day; he wears his brace when he is at work. He says that he did not have any new injury. He is taking Meloxicam and a muscle relaxer but has not gotten any relief from that. He says that he thinks it may be related to previous ACL, meniscus, and bone spurs seen on imaging as it feels the same as in the past and has gotten progressively worse.

## 2023-01-21 ENCOUNTER — Other Ambulatory Visit: Payer: Self-pay

## 2023-01-21 ENCOUNTER — Emergency Department (HOSPITAL_COMMUNITY)
Admission: EM | Admit: 2023-01-21 | Discharge: 2023-01-21 | Disposition: A | Discharge status: Home / Self Care | Payer: MEDICAID | Attending: Emergency Medicine | Admitting: Emergency Medicine

## 2023-01-21 DIAGNOSIS — M25562 Pain in left knee: Secondary | ICD-10-CM | POA: Insufficient documentation

## 2023-01-21 MED ORDER — HYDROCODONE-ACETAMINOPHEN 5-325 MG PO TABS
1.0000 | ORAL_TABLET | Freq: Once | ORAL | Status: AC
  Administered 2023-01-21: 1 via ORAL
  Filled 2023-01-21: qty 1

## 2023-01-21 MED ORDER — METHYLPREDNISOLONE 4 MG PO TBPK: ORAL_TABLET | ORAL | 0 refills | Status: DC

## 2023-01-21 NOTE — ED Provider Notes (Signed)
[place] EMERGENCY DEPARTMENT AT [facility] Provider Note   CSN: [id number] Arrival date & time: 01/21/23  0011     History  Chief Complaint  Patient presents with   Knee Pain    Andrew Turner is a 32 y.o. male.  Patient with known osteoarthritis, ligamentous injury, meniscal injury in the left knee presents to the emergency department complaining of continued left knee pain.  He was seen at orthopedics yesterday and was given a Toradol injection in the left knee.  They avoided corticosteroids in the knee at that time due to potential for surgery in the near future.  He states he has had continued pain since the injection and feels that he has intermittent swelling.  He denies fevers, nausea, vomiting.  He is able to ambulate.  He has a knee brace at home.  The patient works on his feet throughout the day.  Past medical history otherwise significant for major depressive disorder, generalized anxiety disorder   Knee Pain      Home Medications Prior to Admission medications   Medication Sig Start Date End Date Taking? Authorizing Provider  cyclobenzaprine (FLEXERIL) 5 MG tablet Take 1-2 tablets (5-10 mg total) by mouth at bedtime. 11/10/22   Madelyn Brunner, DO  ibuprofen (ADVIL) 400 MG tablet Take 400 mg by mouth every 6 (six) hours as needed.    [provider]  meloxicam (MOBIC) 15 MG tablet Take 1 tablet (15 mg total) by mouth daily. 01/19/23   Madelyn Brunner, DO  methylPREDNISolone (MEDROL DOSEPAK) 4 MG TBPK tablet Take as directed per package instructions 01/21/23  Yes Darrick Grinder, PA-C      Allergies    Patient has no known allergies.    Review of Systems   Review of Systems  Physical Exam Updated Vital Signs BP (!) 129/90   Pulse 86   Temp 97.9 F (36.6 C)   Resp 18   SpO2 98%  Physical Exam Vitals and nursing note reviewed.  HENT:     Head: Normocephalic and atraumatic.  Eyes:     Pupils: Pupils are equal, round, and reactive to light.   Pulmonary:     Effort: Pulmonary effort is normal. No respiratory distress.  Musculoskeletal:        General: Tenderness present. No swelling or signs of injury.     Cervical back: Normal range of motion.     Comments: Mild tenderness to palpation of the left knee.  Grossly normal range of motion.  No significant swelling noted.  No overlying erythema.  Temperature similar to contralateral side.  Skin:    General: Skin is warm and dry.     Findings: No erythema.  Neurological:     Mental Status: He is alert.  Psychiatric:        Speech: Speech normal.        Behavior: Behavior normal.     ED Results / Procedures / Treatments   Labs (all labs ordered are listed, but only abnormal results are displayed) Labs Reviewed - No data to display  EKG None  Radiology XR Knee Complete 4 Views Left  Result Date: 01/19/2023 4 views of the left knee including standing AP, Rosenberg, lateral and sunrise view were ordered and reviewed by myself.  X-rays demonstrate moderate tibiofemoral arthritic change with tibial subluxation falling into a valgus fashion.  There is medial femoral condyle spurring.  On the lateral view there is proximal tibial for displacement likely from underlying ACL rupture or  insufficiency.  Small joint effusion noted.  There is surgical clips in the lateral femoral condyle from prior ACL surgery.  No acute fracture.   Procedures Procedures    Medications Ordered in ED Medications  HYDROcodone-acetaminophen (NORCO/VICODIN) 5-325 MG per tablet 1 tablet (has no administration in time range)    ED Course/ Medical Decision Making/ A&P                                 Medical Decision Making  This patient presents to the ED for concern of knee pain, this involves an extensive number of treatment options, and is a complaint that carries with it a high risk of complications and morbidity.  The differential diagnosis includes ligamentous injury, meniscal injury, fracture,  dislocation, septic arthritis, others   Co morbidities that complicate the patient evaluation  Known ligamentous issues with left knee   Additional history obtained:   External records from outside source obtained and reviewed including orthopedic notes from earlier this week, outside imaging   Problem List / ED Course / Critical interventions / Medication management   I ordered medication including Norco for pain Reevaluation of the patient after these medicines showed that the patient improved I have reviewed the patients home medicines and have made adjustments as needed    Test / Admission - Considered:  No signs of septic arthritis.  Patient with known bone spurs and ligamentous issues with left knee.  There are plans for follow-ups with orthopedic surgery along with a sports medicine provider.  I see no indication to start more aggressive therapy at this time.  Treated here in the emerged department with Norco.  Will prescribe a Medrol Dosepak for inflammation.  I have recommended that the patient follow-up with sports medicine to check on safety of Medrol Dosepak in relationship to possible upcoming surgery.  Also recommend the patient follow-up with sports medicine to discuss reaction to Toradol injection.  No indication for further emergent workup at this time.         Final Clinical Impression(s) / ED Diagnoses Final diagnoses:  Left knee pain, unspecified chronicity    Rx / DC Orders ED Discharge Orders          Ordered    methylPREDNISolone (MEDROL DOSEPAK) 4 MG TBPK tablet        01/21/23 0524              Darrick Grinder, PA-C 01/21/23 0525    Dione Booze, MD 01/21/23 [phone]

## 2023-01-21 NOTE — ED Triage Notes (Signed)
Pt reporting left knee pain for several weeks. Pt was seen at ortho yesterday for the same. Pt says he feels like he has had intermittent swelling and when he walks his knee "buckles"

## 2023-01-21 NOTE — Discharge Instructions (Signed)
Please follow-up with sports medicine.  I recommend that you seek their input before starting the Medrol Dosepak I have prescribed to be sure that it will not interfere with any upcoming surgeries.  I also recommend following up with them to discuss your reaction to the Toradol injection to see if they have further recommendations.

## 2023-02-04 ENCOUNTER — Ambulatory Visit
Admission: RE | Admit: 2023-02-04 | Discharge: 2023-02-04 | Disposition: A | Discharge status: Home / Self Care | Payer: MEDICAID | Source: Ambulatory Visit | Attending: Emergency Medicine | Admitting: Emergency Medicine

## 2023-02-04 VITALS — BP 136/86 | HR 104 | Temp 98.0°F | Resp 18 | Ht 68.0 in | Wt 180.0 lb

## 2023-02-04 DIAGNOSIS — J0100 Acute maxillary sinusitis, unspecified: Secondary | ICD-10-CM

## 2023-02-04 LAB — POC COVID19/FLU A&B COMBO
Covid Antigen, POC: NEGATIVE
Influenza A Antigen, POC: NEGATIVE
Influenza B Antigen, POC: NEGATIVE

## 2023-02-04 LAB — POCT RAPID STREP A (OFFICE): Rapid Strep A Screen: NEGATIVE

## 2023-02-04 MED ORDER — AMOXICILLIN-POT CLAVULANATE 875-125 MG PO TABS: 1.0000 | ORAL_TABLET | Freq: Two times a day (BID) | ORAL | 0 refills | Status: DC

## 2023-02-04 NOTE — ED Provider Notes (Signed)
[facility]    CSN: [id number] Arrival date & time: 02/04/23  1748      History   Chief Complaint Chief Complaint  Patient presents with   Sore Throat    And sinus area just haven't felt good in 2 weeks. - Entered by patient    HPI Andrew Turner is a 32 y.o. male.   Patient presents with concerns of feeling unwell for the past 2-3 weeks. He reports nasal congestion, sinus pressure, throat irritation, cough, headache, and fatigue. He states the past few days it seems to be getting worse, mainly the pressure in his left cheek and has had some swelling intermittently to the area. He has been taking OTC medicine with minimal improvement. He denies fever, ear pain, or difficulty breathing.   The history is provided by the patient.  Sore Throat Associated symptoms include headaches. Pertinent negatives include no shortness of breath.    Past Medical History:  Diagnosis Date   Anxiety    Depression    Irregular heart rhythm    Pneumonia due to COVID-19 virus     Patient Active Problem List   Diagnosis Date Noted   Thyroid disorder screening 07/06/2022   Chronic left-sided thoracic back pain 05/21/2022   Moderate episode of recurrent major depressive disorder (HCC) 05/03/2020   Severe major depression with psychotic features (HCC) 05/03/2020   Generalized anxiety disorder 05/03/2020    Past Surgical History:  Procedure Laterality Date   KNEE SURGERY Left        Home Medications    Prior to Admission medications   Medication Sig Start Date End Date Taking? Authorizing Provider  amoxicillin-clavulanate (AUGMENTIN) 875-125 MG tablet Take 1 tablet by mouth every 12 (twelve) hours. 02/04/23  Yes Enda Santo L, PA  cyclobenzaprine (FLEXERIL) 5 MG tablet Take 1-2 tablets (5-10 mg total) by mouth at bedtime. 11/10/22   Madelyn Brunner, DO  ibuprofen (ADVIL) 400 MG tablet Take 400 mg by mouth every 6 (six) hours as needed.    [provider]  meloxicam (MOBIC)  15 MG tablet Take 1 tablet (15 mg total) by mouth daily. 01/19/23   Madelyn Brunner, DO  methylPREDNISolone (MEDROL DOSEPAK) 4 MG TBPK tablet Take as directed per package instructions 01/21/23   Darrick Grinder, PA-C    Family History Family History  Problem Relation Age of Onset   Atrial fibrillation Mother     Social History Social History   Tobacco Use   Smoking status: Former   Smokeless tobacco: Never  Vaping Use   Vaping status: Never Used  Substance Use Topics   Alcohol use: Not Currently   Drug use: Not Currently     Allergies   Patient has no known allergies.   Review of Systems Review of Systems  Constitutional:  Positive for fatigue. Negative for fever.  HENT:  Positive for congestion, postnasal drip, sinus pressure and sore throat. Negative for ear pain.   Eyes:  Negative for visual disturbance.  Respiratory:  Positive for cough. Negative for shortness of breath.   Gastrointestinal:  Negative for nausea and vomiting.  Musculoskeletal:  Negative for myalgias.  Neurological:  Positive for headaches. Negative for dizziness.     Physical Exam Triage Vital Signs ED Triage Vitals  Encounter Vitals Group     BP 02/04/23 1817 136/86     Systolic BP Percentile --      Diastolic BP Percentile --      Pulse Rate 02/04/23 1817 (!) 104  Resp 02/04/23 1817 18     Temp 02/04/23 1817 98 F (36.7 C)     Temp Source 02/04/23 1817 Oral     SpO2 02/04/23 1817 98 %     Weight 02/04/23 1818 180 lb (81.6 kg)     Height 02/04/23 1818 5\' 8"  (1.727 m)     Head Circumference --      Peak Flow --      Pain Score 02/04/23 1816 10     Pain Loc --      Pain Education --      Exclude from Growth Chart --    No data found.  Updated Vital Signs BP 136/86 (BP Location: Left Arm)   Pulse (!) 104   Temp 98 F (36.7 C) (Oral)   Resp 18   Ht 5\' 8"  (1.727 m)   Wt 180 lb (81.6 kg)   SpO2 98%   BMI 27.37 kg/m   Visual Acuity Right Eye Distance:   Left Eye Distance:    Bilateral Distance:    Right Eye Near:   Left Eye Near:    Bilateral Near:     Physical Exam Vitals and nursing note reviewed.  Constitutional:      General: He is not in acute distress. HENT:     Head: Normocephalic.     Nose: Congestion present. No rhinorrhea.     Left Sinus: Maxillary sinus tenderness present.     Mouth/Throat:     Mouth: Mucous membranes are moist.     Pharynx: Oropharynx is clear.  Eyes:     Conjunctiva/sclera: Conjunctivae normal.     Pupils: Pupils are equal, round, and reactive to light.  Cardiovascular:     Rate and Rhythm: Normal rate and regular rhythm.     Heart sounds: Normal heart sounds.  Pulmonary:     Effort: Pulmonary effort is normal.     Breath sounds: Normal breath sounds.  Musculoskeletal:     Cervical back: Normal range of motion.  Lymphadenopathy:     Cervical: No cervical adenopathy.  Skin:    Findings: No rash.  Neurological:     Mental Status: He is alert.  Psychiatric:        Mood and Affect: Mood normal.      UC Treatments / Results  Labs (all labs ordered are listed, but only abnormal results are displayed) Labs Reviewed  POCT RAPID STREP A (OFFICE)  POC COVID19/FLU A&B COMBO    EKG   Radiology No results found.  Procedures Procedures (including critical care time)  Medications Ordered in UC Medications - No data to display  Initial Impression / Assessment and Plan / UC Course  I have reviewed the triage vital signs and the nursing notes.  Pertinent labs & imaging results that were available during my care of the patient were reviewed by me and considered in my medical decision making (see chart for details).     Empiric tx for sinusitis given 2-3 wk of symptoms, sinus tenderness. Discussed return precautions.   E/M: 1 acute uncomplicated illness, no data, moderate risk due to prescription management  Final Clinical Impressions(s) / UC Diagnoses   Final diagnoses:  Acute non-recurrent maxillary  sinusitis     Discharge Instructions      Take antibiotics as prescribed. Can continue with OTC medicine. Nasal saline rinse or spray can also help. Return to care if no improvement after completing antibiotics.     ED Prescriptions     Medication  Sig Dispense Auth. Provider   amoxicillin-clavulanate (AUGMENTIN) 875-125 MG tablet Take 1 tablet by mouth every 12 (twelve) hours. 14 tablet Vallery Sa, Thai Hemrick L, [place]      PDMP not reviewed this encounter.   Estanislado Pandy, [place] 02/04/23 1920

## 2023-02-04 NOTE — Discharge Instructions (Signed)
Take antibiotics as prescribed. Can continue with OTC medicine. Nasal saline rinse or spray can also help. Return to care if no improvement after completing antibiotics.

## 2023-02-04 NOTE — ED Triage Notes (Addendum)
Patient presents with congestion, hot and cold, sinus headache, sinus pressure on left side. Treated with sinus headache medicine. Symptoms have been 3 weeks, the only change has been coughing more and soreness in throat on left side.

## 2023-02-05 IMAGING — DX DG CHEST 1V PORT
1 series · 1 of 1 positions shown · non-contrast
Comparison: None.

CLINICAL DATA: Cough and congestion

EXAM:
PORTABLE CHEST 1 VIEW

[chest ap]
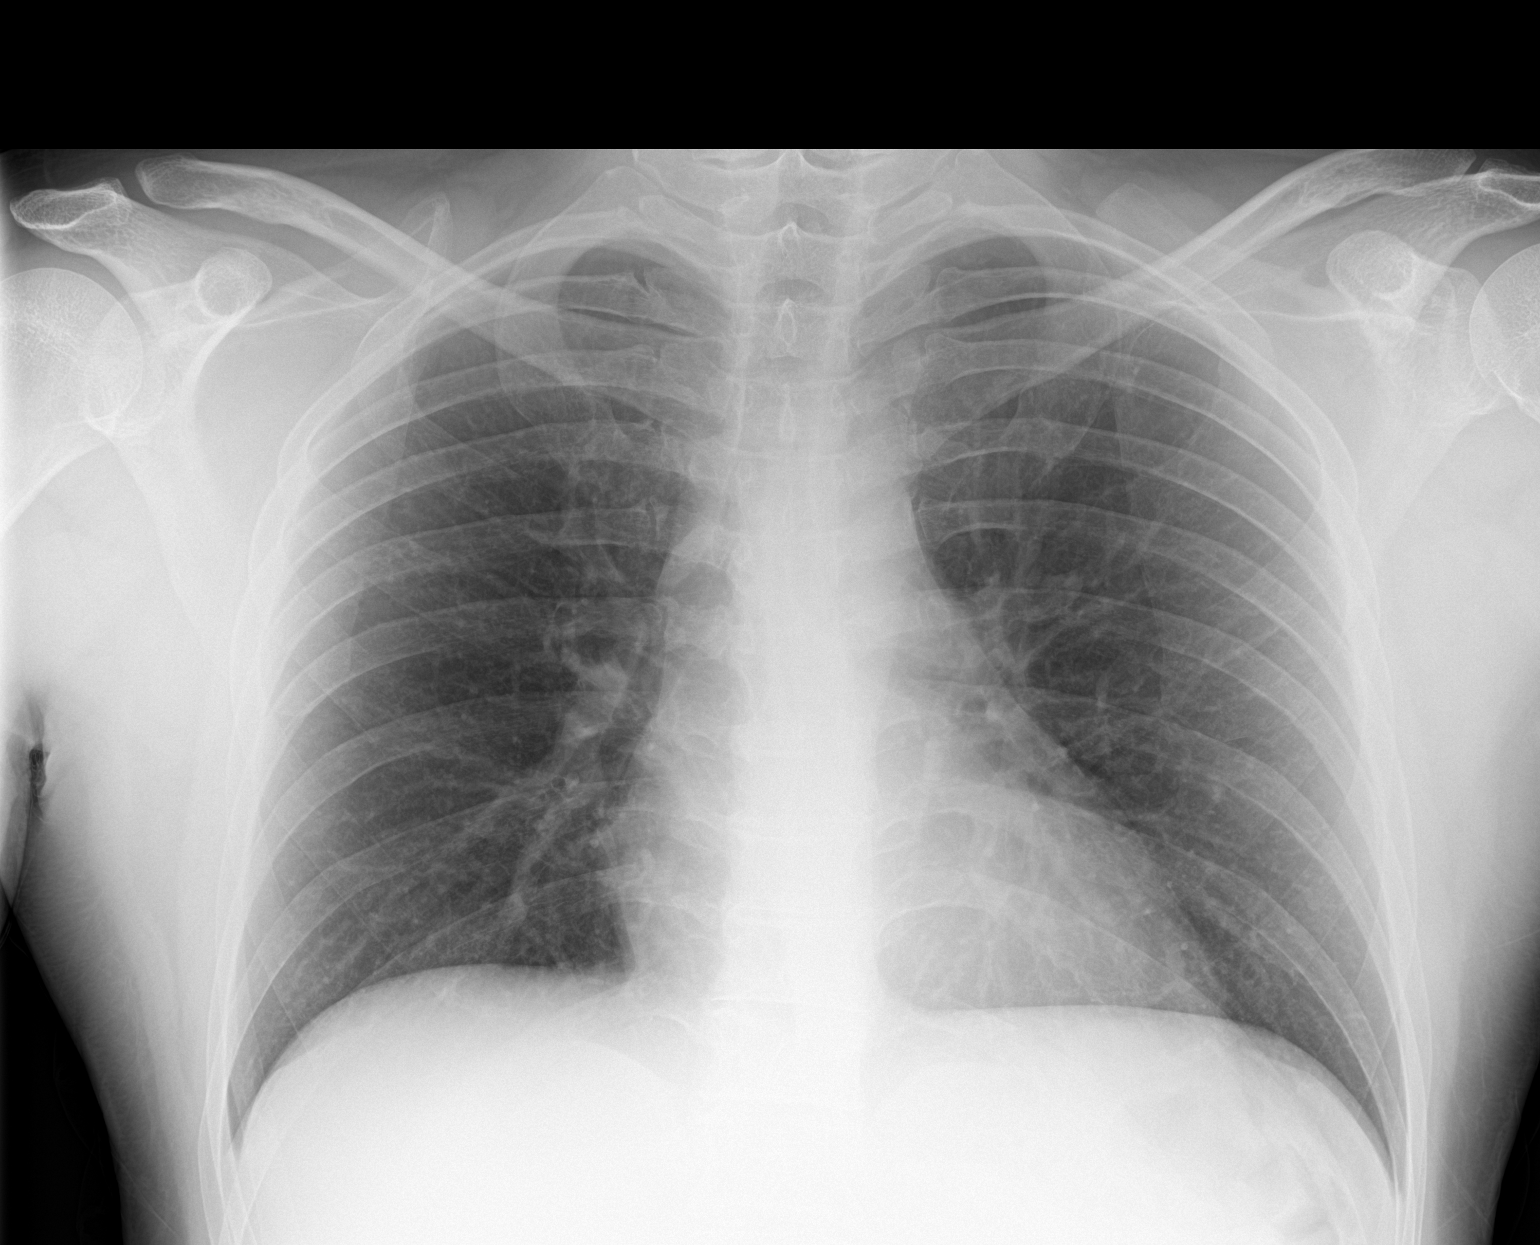

[1 of 1 positions shown; findings below may reference images not displayed]

FINDINGS: The heart size and mediastinal contours are within normal limits.
Both lungs are clear. The visualized skeletal structures are
unremarkable.
IMPRESSION: No active disease.

## 2023-02-11 ENCOUNTER — Ambulatory Visit (HOSPITAL_BASED_OUTPATIENT_CLINIC_OR_DEPARTMENT_OTHER): Payer: MEDICAID

## 2023-02-11 ENCOUNTER — Encounter (HOSPITAL_BASED_OUTPATIENT_CLINIC_OR_DEPARTMENT_OTHER): Payer: Self-pay | Admitting: Student

## 2023-02-11 ENCOUNTER — Ambulatory Visit (INDEPENDENT_AMBULATORY_CARE_PROVIDER_SITE_OTHER): Payer: MEDICAID | Admitting: Student

## 2023-02-11 ENCOUNTER — Ambulatory Visit: Payer: MEDICAID

## 2023-02-11 DIAGNOSIS — G8929 Other chronic pain: Secondary | ICD-10-CM | POA: Diagnosis not present

## 2023-02-11 DIAGNOSIS — M25562 Pain in left knee: Secondary | ICD-10-CM | POA: Diagnosis not present

## 2023-02-11 DIAGNOSIS — M1711 Unilateral primary osteoarthritis, right knee: Secondary | ICD-10-CM | POA: Diagnosis not present

## 2023-02-11 DIAGNOSIS — S83512S Sprain of anterior cruciate ligament of left knee, sequela: Secondary | ICD-10-CM | POA: Diagnosis not present

## 2023-02-11 NOTE — Progress Notes (Signed)
Chief Complaint: Left knee pain     History of Present Illness:    Andrew Turner is a 32 y.o. male presents today for further evaluation of his left knee.  He has an extensive history of this knee dating back to 2007 when he sustain and ACL, MCL, and meniscus tear due to a football injury.  He had this repaired in 2011 followed by a retear of the ACL and a second subsequent surgery.  Patient reports that in May 2023 he had an MRI with and worked with [facility] which revealed disruption of the ACL, osteoarthritis, and a partial meniscus tear.  Current pain in the left knee has been ongoing since last May.  Does report occasional buckling.  Pain is constant and moderate in severity.  Did receive an intra-articular Toradol injection with Dr. Shon Baton on 11/19 which unfortunately did not provide any significant relief.   Surgical History:   Left ACL reconstruction x 2- 2011  PMH/PSH/Family History/Social History/Meds/Allergies:    Past Medical History:  Diagnosis Date   Anxiety    Depression    Irregular heart rhythm    Pneumonia due to COVID-19 virus    Past Surgical History:  Procedure Laterality Date   KNEE SURGERY Left    Social History   Socioeconomic History   Marital status: Single    Spouse name: Not on file   Number of children: Not on file   Years of education: Not on file   Highest education level: Some college, no degree  Occupational History   Not on file  Tobacco Use   Smoking status: Former   Smokeless tobacco: Never  Vaping Use   Vaping status: Never Used  Substance and Sexual Activity   Alcohol use: Not Currently   Drug use: Not Currently   Sexual activity: Yes  Other Topics Concern   Not on file  Social History Narrative   Not on file   Social Drivers of Health   Financial Resource Strain: Patient Declined (07/05/2022)   Overall Financial Resource Strain (CARDIA)    Difficulty of Paying Living Expenses: Patient declined   Food Insecurity: Patient Declined (07/05/2022)   Hunger Vital Sign    Worried About Running Out of Food in the Last Year: Patient declined    Ran Out of Food in the Last Year: Patient declined  Transportation Needs: Unknown (07/05/2022)   PRAPARE - Transportation    Lack of Transportation (Medical): No    Lack of Transportation (Non-Medical): Patient declined  Physical Activity: Unknown (07/05/2022)   Exercise Vital Sign    Days of Exercise per Week: 0 days    Minutes of Exercise per Session: Not on file  Stress: Stress Concern Present (07/05/2022)   [employer] of Occupational Health - Occupational Stress Questionnaire    Feeling of Stress : Very much  Social Connections: Socially Isolated (07/05/2022)   Social Connection and Isolation Panel [NHANES]    Frequency of Communication with Friends and Family: Once a week    Frequency of Social Gatherings with Friends and Family: Once a week    Attends Religious Services: Never    Database administrator or Organizations: No    Attends Engineer, structural: Not on file    Marital Status: Living with partner   Family History  Problem Relation  Age of Onset   Atrial fibrillation Mother    No Known Allergies Current Outpatient Medications  Medication Sig Dispense Refill   amoxicillin-clavulanate (AUGMENTIN) 875-125 MG tablet Take 1 tablet by mouth every 12 (twelve) hours. 14 tablet 0   cyclobenzaprine (FLEXERIL) 5 MG tablet Take 1-2 tablets (5-10 mg total) by mouth at bedtime. 90 tablet 0   ibuprofen (ADVIL) 400 MG tablet Take 400 mg by mouth every 6 (six) hours as needed.     meloxicam (MOBIC) 15 MG tablet Take 1 tablet (15 mg total) by mouth daily. 30 tablet 1   methylPREDNISolone (MEDROL DOSEPAK) 4 MG TBPK tablet Take as directed per package instructions 21 tablet 0   No current facility-administered medications for this visit.   No results found.  Review of Systems:   A ROS was performed including pertinent positives and  negatives as documented in the HPI.  Physical Exam :   Constitutional: NAD and appears stated age Neurological: Alert and oriented Psych: Appropriate affect and cooperative There were no vitals taken for this visit.   Comprehensive Musculoskeletal Exam:    Previous arthroscopic incision scars that are well healed of the left knee.  Active range of motion from 0-110 degrees.  No laxity with varus or valgus stress.  Positive Lachman and anterior drawer tests.  Imaging:   Xray (bone length): Left knee osteoarthritis with peripheral spurring.  Prior ACL postsurgical changes.   I personally reviewed and interpreted the radiographs.   Assessment:   32 y.o. male with history of recurrent left knee ACL ruptures.  In the last ACL surgery was in 2011 but MRI from last year shows that the ACL is no longer intact.  As result he has developed some significant osteoarthritis in the knee.  Given this history, I we will plan to begin workup today with limb length views of the lower extremities followed by a repeat left knee MRI as previous images are now over a year and a half old.  Once MRI is completed, we will have patient return to follow-up with Dr. Steward Drone to discuss options of intervention.  Plan :    - Obtain MRI of the left knee and return to clinic for treatment discussion with Dr. Steward Drone     I personally saw and evaluated the patient, and participated in the management and treatment plan.  Hazle Nordmann, PA-C Orthopedics

## 2023-02-17 ENCOUNTER — Ambulatory Visit (HOSPITAL_BASED_OUTPATIENT_CLINIC_OR_DEPARTMENT_OTHER): Payer: MEDICAID | Admitting: Orthopaedic Surgery

## 2023-02-28 ENCOUNTER — Ambulatory Visit
Admission: RE | Admit: 2023-02-28 | Discharge: 2023-02-28 | Disposition: A | Discharge status: Home / Self Care | Payer: MEDICAID | Source: Ambulatory Visit | Attending: Student | Admitting: Student

## 2023-02-28 DIAGNOSIS — M1711 Unilateral primary osteoarthritis, right knee: Secondary | ICD-10-CM

## 2023-03-12 ENCOUNTER — Ambulatory Visit (HOSPITAL_BASED_OUTPATIENT_CLINIC_OR_DEPARTMENT_OTHER): Payer: Self-pay | Admitting: Orthopaedic Surgery

## 2023-03-12 ENCOUNTER — Ambulatory Visit (HOSPITAL_BASED_OUTPATIENT_CLINIC_OR_DEPARTMENT_OTHER): Payer: MEDICAID | Admitting: Orthopaedic Surgery

## 2023-03-12 DIAGNOSIS — M25562 Pain in left knee: Secondary | ICD-10-CM | POA: Diagnosis not present

## 2023-03-12 DIAGNOSIS — S83512S Sprain of anterior cruciate ligament of left knee, sequela: Secondary | ICD-10-CM

## 2023-03-12 NOTE — Progress Notes (Signed)
 Chief Complaint: Left knee pain        History of Present Illness:    03/12/2023: Follow-up for MRI discussion of the left knee   Andrew Turner is a 33 y.o. male presents today for further evaluation of his left knee.  He has an extensive history of this knee dating back to 2007 when he sustain and ACL, MCL, and meniscus tear due to a football injury.  He had this repaired in 2011 followed by a retear of the ACL and a second subsequent surgery.  Patient reports that in May 2023 he had an MRI with and worked with [facility] which revealed disruption of the ACL, osteoarthritis, and a partial meniscus tear.  Current pain in the left knee has been ongoing since last May.  Does report occasional buckling.  Pain is constant and moderate in severity.  Did receive an intra-articular Toradol  injection with Dr. Burnetta on 11/19 which unfortunately did not provide any significant relief.     Surgical History:   Left ACL reconstruction x 2- 2011   PMH/PSH/Family History/Social History/Meds/Allergies:         Past Medical History:  Diagnosis Date   Anxiety     Depression     Irregular heart rhythm     Pneumonia due to COVID-19 virus               Past Surgical History:  Procedure Laterality Date   KNEE SURGERY Left          Social History         Socioeconomic History   Marital status: Single      Spouse name: Not on file   Number of children: Not on file   Years of education: Not on file   Highest education level: Some college, no degree  Occupational History   Not on file  Tobacco Use   Smoking status: Former   Smokeless tobacco: Never  Vaping Use   Vaping status: Never Used  Substance and Sexual Activity   Alcohol use: Not Currently   Drug use: Not Currently   Sexual activity: Yes  Other Topics Concern   Not on file  Social History Narrative   Not on file    Social Drivers of Health        Financial Resource Strain: Patient Declined (07/05/2022)    Overall  Financial Resource Strain (CARDIA)     Difficulty of Paying Living Expenses: Patient declined  Food Insecurity: Patient Declined (07/05/2022)    Hunger Vital Sign     Worried About Running Out of Food in the Last Year: Patient declined     Ran Out of Food in the Last Year: Patient declined  Transportation Needs: Unknown (07/05/2022)    PRAPARE - Transportation     Lack of Transportation (Medical): No     Lack of Transportation (Non-Medical): Patient declined  Physical Activity: Unknown (07/05/2022)    Exercise Vital Sign     Days of Exercise per Week: 0 days     Minutes of Exercise per Session: Not on file  Stress: Stress Concern Present (07/05/2022)    [employer] of Occupational Health - Occupational Stress Questionnaire     Feeling of Stress : Very much  Social Connections: Socially Isolated (07/05/2022)    Social Connection and Isolation Panel [NHANES]     Frequency of Communication with Friends and Family: Once a week     Frequency of Social Gatherings with Friends and Family: Once  a week     Attends Religious Services: Never     Active Member of Clubs or Organizations: No     Attends Engineer, Structural: Not on file     Marital Status: Living with partner         Family History  Problem Relation Age of Onset   Atrial fibrillation Mother          Allergies  No Known Allergies         Current Outpatient Medications  Medication Sig Dispense Refill   amoxicillin -clavulanate (AUGMENTIN ) 875-125 MG tablet Take 1 tablet by mouth every 12 (twelve) hours. 14 tablet 0   cyclobenzaprine  (FLEXERIL ) 5 MG tablet Take 1-2 tablets (5-10 mg total) by mouth at bedtime. 90 tablet 0   ibuprofen (ADVIL) 400 MG tablet Take 400 mg by mouth every 6 (six) hours as needed.       meloxicam  (MOBIC ) 15 MG tablet Take 1 tablet (15 mg total) by mouth daily. 30 tablet 1   methylPREDNISolone  (MEDROL  DOSEPAK) 4 MG TBPK tablet Take as directed per package instructions 21 tablet 0      No  current facility-administered medications for this visit.      Imaging Results (Last 48 hours)  No results found.     Review of Systems:   A ROS was performed including pertinent positives and negatives as documented in the HPI.   Physical Exam :   Constitutional: NAD and appears stated age Neurological: Alert and oriented Psych: Appropriate affect and cooperative There were no vitals taken for this visit.    Comprehensive Musculoskeletal Exam:     Previous arthroscopic incision scars that are well healed of the left knee.  Active range of motion from -3 - 135 degrees.  No laxity with varus or valgus stress.  Positive Lachman and anterior drawer tests, negative posterior drawer with positive medial and lateral McMurray   Imaging:   Xray (bone length): Left knee osteoarthritis with peripheral spurring.  Prior ACL postsurgical changes.   MRI left knee: Full-thickness tearing of the ACL with medial and lateral meniscal insufficiency, normal alignment on bone length view.  There is some chondral thinning laterally although this is mild I personally reviewed and interpreted the radiographs.     Assessment:   33 y.o. male with history of recurrent left knee ACL ruptures.  In the last ACL surgery was in 2011 but MRI from last year shows that the ACL is no longer intact.  I did discuss his MRI findings today are consistent with ACL insufficiency and a complete medial lateral meniscal insufficiency in the setting of previous multiple debridements.  He was under the impression that he does have extensive osteoarthritis although I did discuss that on both his x-rays and his MRI I do see significant cartilage and overall if anything which is called this mild chondral thinning.  Unfortunately he does have medial lateral meniscal deficiency and in order to give him the best possible recovery I do believe he may ultimately benefit from medial and lateral meniscal repair.  I did discuss that given his  young age I would recommend ACL reconstruction with quadriceps tendon autograft.  I discussed the risks and limitations associated with these.  Given the fact that he does have some chondral thinning I would recommend against lateral extra-articular tenodesis at this time as there would be concern for progression of osteoarthritis but I do believe he would overall do well with an autograft.  He will consider  his options and send me a message when they have consider   Plan :     -Plan for likely left knee arthroscopy with medial and lateral meniscal transplant and anterior cruciate ligament reconstruction with quadriceps tendon autograft   After a lengthy discussion of treatment options, including risks, benefits, alternatives, complications of surgical and nonsurgical conservative options, the patient elected surgical repair.   The patient  is aware of the material risks  and complications including, but not limited to injury to adjacent structures, neurovascular injury, infection, numbness, bleeding, implant failure, thermal burns, stiffness, persistent pain, failure to heal, disease transmission from allograft, need for further surgery, dislocation, anesthetic risks, blood clots, risks of death,and others. The probabilities of surgical success and failure discussed with patient given their particular co-morbidities.The time and nature of expected rehabilitation and recovery was discussed.The patient's questions were all answered preoperatively.  No barriers to understanding were noted. I explained the natural history of the disease process and Rx rationale.  I explained to the patient what I considered to be reasonable expectations given their personal situation.  The final treatment plan was arrived at through a shared patient decision making process model.          I personally saw and evaluated the patient, and participated in the management and treatment plan.

## 2023-03-13 ENCOUNTER — Encounter (HOSPITAL_BASED_OUTPATIENT_CLINIC_OR_DEPARTMENT_OTHER): Payer: Self-pay | Admitting: Orthopaedic Surgery

## 2023-03-31 ENCOUNTER — Other Ambulatory Visit: Payer: Self-pay | Admitting: Sports Medicine

## 2023-03-31 MED ORDER — CYCLOBENZAPRINE HCL 5 MG PO TABS: 5.0000 mg | ORAL_TABLET | Freq: Every day | ORAL | 0 refills | Status: DC

## 2023-04-22 ENCOUNTER — Ambulatory Visit
Admission: EM | Admit: 2023-04-22 | Discharge: 2023-04-22 | Disposition: A | Discharge status: Home / Self Care | Payer: MEDICAID | Attending: Family Medicine | Admitting: Family Medicine

## 2023-04-22 ENCOUNTER — Encounter: Payer: Self-pay | Admitting: Emergency Medicine

## 2023-04-22 DIAGNOSIS — K529 Noninfective gastroenteritis and colitis, unspecified: Secondary | ICD-10-CM

## 2023-04-22 DIAGNOSIS — K219 Gastro-esophageal reflux disease without esophagitis: Secondary | ICD-10-CM | POA: Diagnosis not present

## 2023-04-22 MED ORDER — ONDANSETRON 4 MG PO TBDP: 4.0000 mg | ORAL_TABLET | Freq: Three times a day (TID) | ORAL | 0 refills | Status: AC | PRN

## 2023-04-22 MED ORDER — ONDANSETRON 4 MG PO TBDP
4.0000 mg | ORAL_TABLET | Freq: Once | ORAL | Status: AC
  Administered 2023-04-22: 4 mg via ORAL

## 2023-04-22 MED ORDER — OMEPRAZOLE 40 MG PO CPDR: 40.0000 mg | DELAYED_RELEASE_CAPSULE | Freq: Every day | ORAL | 0 refills | Status: AC

## 2023-04-22 NOTE — Discharge Instructions (Signed)
Ondansetron dissolved in the mouth every 8 hours as needed for nausea or vomiting. Clear liquids(water, gatorade/pedialyte, ginger ale/sprite, chicken broth/soup) and bland things(crackers/toast, rice, potato, bananas) to eat. Avoid acidic foods like lemon/lime/orange/tomato, and avoid greasy/spicy foods.  We have given you 1 dose of this medication here in the office  Take omeprazole 40 mg--1 capsule daily for stomach acid.  You can use the QR code/website at the back of the summary paperwork to schedule yourself a new patient appointment with primary care

## 2023-04-22 NOTE — ED Triage Notes (Addendum)
Pt reports severe stomach pain and nausea x2 days. 1 emesis episode last night. 6 diarrhea stools in the last 24 hrs. Decreased intake but has tried to stay hydrated all day. No OTC med use for symptoms. Needs work note.  Pt also reports general issues with feeling the need to throw up when he lays down at night. Issue since 2020. Not acid reflux. Has not followed up with PCP.

## 2023-04-23 NOTE — ED Provider Notes (Signed)
[facility]    CSN: [id number] Arrival date & time: 04/22/23  1857      History   Chief Complaint Chief Complaint  Patient presents with   Emesis   Diarrhea    HPI Andrew Turner is a 33 y.o. male.    Emesis Associated symptoms: diarrhea   Diarrhea Associated symptoms: vomiting   Here for n/v/d. Symptoms began overnight and he had several episodes of vomiting. Last emesis about 16 hours ago, though still nauseated. Had numerous loose stools also, but last BM was last night. Having some RUQ pain.  No f/c.  No cough or congestion that's new.  NKDA  He has had a more chronic problem of feeling queasy or nauseated when lying down, since 2020  Past Medical History:  Diagnosis Date   Anxiety    Depression    Irregular heart rhythm    Pneumonia due to COVID-19 virus     Patient Active Problem List   Diagnosis Date Noted   Thyroid disorder screening 07/06/2022   Chronic left-sided thoracic back pain 05/21/2022   Moderate episode of recurrent major depressive disorder (HCC) 05/03/2020   Severe major depression with psychotic features (HCC) 05/03/2020   Generalized anxiety disorder 05/03/2020    Past Surgical History:  Procedure Laterality Date   KNEE SURGERY Left        Home Medications    Prior to Admission medications   Medication Sig Start Date End Date Taking? Authorizing Provider  cyclobenzaprine (FLEXERIL) 5 MG tablet Take 1-2 tablets (5-10 mg total) by mouth at bedtime. 03/31/23  Yes Madelyn Brunner, DO  meloxicam (MOBIC) 15 MG tablet Take 1 tablet (15 mg total) by mouth daily. 01/19/23  Yes Madelyn Brunner, DO  omeprazole (PRILOSEC) 40 MG capsule Take 1 capsule (40 mg total) by mouth daily. 04/22/23  Yes Zenia Resides, MD  ondansetron (ZOFRAN-ODT) 4 MG disintegrating tablet Take 1 tablet (4 mg total) by mouth every 8 (eight) hours as needed for nausea or vomiting. 04/22/23  Yes Jacklyne Baik, Janace Aris, MD    Family History Family History  Problem  Relation Age of Onset   Atrial fibrillation Mother     Social History Social History   Tobacco Use   Smoking status: Former   Smokeless tobacco: Never  Vaping Use   Vaping status: Never Used  Substance Use Topics   Alcohol use: Not Currently   Drug use: Not Currently     Allergies   Patient has no known allergies.   Review of Systems Review of Systems  Gastrointestinal:  Positive for diarrhea and vomiting.     Physical Exam Triage Vital Signs ED Triage Vitals  Encounter Vitals Group     BP 04/22/23 1956 124/81     Systolic BP Percentile --      Diastolic BP Percentile --      Pulse Rate 04/22/23 1956 100     Resp 04/22/23 1956 18     Temp 04/22/23 1956 97.6 F (36.4 C)     Temp Source 04/22/23 1956 Oral     SpO2 04/22/23 1956 97 %     Weight --      Height --      Head Circumference --      Peak Flow --      Pain Score 04/22/23 1957 6     Pain Loc --      Pain Education --      Exclude from Growth Chart --  No data found.  Updated Vital Signs BP 124/81 (BP Location: Left Arm)   Pulse 100   Temp 97.6 F (36.4 C) (Oral)   Resp 18   SpO2 97%   Visual Acuity Right Eye Distance:   Left Eye Distance:   Bilateral Distance:    Right Eye Near:   Left Eye Near:    Bilateral Near:     Physical Exam Vitals reviewed.  Constitutional:      General: He is not in acute distress.    Appearance: He is not toxic-appearing.  HENT:     Nose: Nose normal.     Mouth/Throat:     Mouth: Mucous membranes are moist.     Pharynx: No oropharyngeal exudate or posterior oropharyngeal erythema.  Eyes:     Extraocular Movements: Extraocular movements intact.     Conjunctiva/sclera: Conjunctivae normal.     Pupils: Pupils are equal, round, and reactive to light.  Cardiovascular:     Rate and Rhythm: Normal rate and regular rhythm.     Heart sounds: No murmur heard. Pulmonary:     Effort: Pulmonary effort is normal.     Breath sounds: Normal breath sounds.   Abdominal:     General: Bowel sounds are normal. There is no distension.     Palpations: Abdomen is soft.     Tenderness: There is no guarding.     Comments: Main tenderness is in epigastrium. Milder tenderness in RUQ  Musculoskeletal:     Cervical back: Neck supple.  Lymphadenopathy:     Cervical: No cervical adenopathy.  Skin:    Capillary Refill: Capillary refill takes less than 2 seconds.     Coloration: Skin is not jaundiced or pale.  Neurological:     General: No focal deficit present.     Mental Status: He is alert and oriented to person, place, and time.  Psychiatric:        Behavior: Behavior normal.      UC Treatments / Results  Labs (all labs ordered are listed, but only abnormal results are displayed) Labs Reviewed - No data to display  EKG   Radiology No results found.  Procedures Procedures (including critical care time)  Medications Ordered in UC Medications  ondansetron (ZOFRAN-ODT) disintegrating tablet 4 mg (4 mg Oral Given 04/22/23 2012)    Initial Impression / Assessment and Plan / UC Course  I have reviewed the triage vital signs and the nursing notes.  Pertinent labs & imaging results that were available during my care of the patient were reviewed by me and considered in my medical decision making (see chart for details).     Zofran given here and rx sent   Prilosec sent in for post viral gastritis. I discussed with him that his more chronic issue may be helped by that also. He is given instructions on how to self schedule with primary care to f/u. Final Clinical Impressions(s) / UC Diagnoses   Final diagnoses:  Gastroenteritis  Gastroesophageal reflux disease without esophagitis     Discharge Instructions      Ondansetron dissolved in the mouth every 8 hours as needed for nausea or vomiting. Clear liquids(water, gatorade/pedialyte, ginger ale/sprite, chicken broth/soup) and bland things(crackers/toast, rice, potato, bananas) to  eat. Avoid acidic foods like lemon/lime/orange/tomato, and avoid greasy/spicy foods.  We have given you 1 dose of this medication here in the office  Take omeprazole 40 mg--1 capsule daily for stomach acid.  You can use the QR code/website at the  back of the summary paperwork to schedule yourself a new patient appointment with primary care     ED Prescriptions     Medication Sig Dispense Auth. Provider   omeprazole (PRILOSEC) 40 MG capsule Take 1 capsule (40 mg total) by mouth daily. 15 capsule Zenia Resides, MD   ondansetron (ZOFRAN-ODT) 4 MG disintegrating tablet Take 1 tablet (4 mg total) by mouth every 8 (eight) hours as needed for nausea or vomiting. 10 tablet Marlinda Mike Janace Aris, MD      PDMP not reviewed this encounter.   Zenia Resides, MD 04/23/23 [phone]

## 2023-04-25 ENCOUNTER — Other Ambulatory Visit: Payer: Self-pay | Admitting: Sports Medicine

## 2023-04-26 MED ORDER — CYCLOBENZAPRINE HCL 5 MG PO TABS: 5.0000 mg | ORAL_TABLET | Freq: Every day | ORAL | 0 refills | Status: DC

## 2023-04-27 ENCOUNTER — Other Ambulatory Visit: Payer: Self-pay | Admitting: Sports Medicine

## 2023-04-29 ENCOUNTER — Ambulatory Visit
Admission: EM | Admit: 2023-04-29 | Discharge: 2023-04-29 | Disposition: A | Discharge status: Home / Self Care | Payer: MEDICAID | Attending: Family Medicine | Admitting: Family Medicine

## 2023-04-29 DIAGNOSIS — M62830 Muscle spasm of back: Secondary | ICD-10-CM

## 2023-04-29 DIAGNOSIS — M545 Low back pain, unspecified: Secondary | ICD-10-CM | POA: Diagnosis not present

## 2023-04-29 DIAGNOSIS — G8929 Other chronic pain: Secondary | ICD-10-CM | POA: Diagnosis not present

## 2023-04-29 MED ORDER — KETOROLAC TROMETHAMINE 30 MG/ML IJ SOLN
30.0000 mg | Freq: Once | INTRAMUSCULAR | Status: AC
  Administered 2023-04-29: 30 mg via INTRAMUSCULAR

## 2023-04-29 MED ORDER — DEXAMETHASONE SODIUM PHOSPHATE 10 MG/ML IJ SOLN
10.0000 mg | Freq: Once | INTRAMUSCULAR | Status: AC
  Administered 2023-04-29: 10 mg via INTRAMUSCULAR

## 2023-04-29 NOTE — Discharge Instructions (Addendum)
 Take your next dose of meloxicam tomorrow night.  You may take your muscle relaxer nightly as prescribed.  Follow-up with your primary care doctor or orthopedic specialist symptoms worsen or do not improve.

## 2023-04-29 NOTE — ED Triage Notes (Addendum)
 Pt presents with back spasms. Pt states he ran out of muscle relaxer for 2 days, started back on yesterday. Hurts worst when walking. Pt states he called out of work today and need a drs note.

## 2023-04-29 NOTE — ED Provider Notes (Signed)
 [facility]    CSN: [id number] Arrival date & time: 04/29/23  1829      History   Chief Complaint Chief Complaint  Patient presents with   Back Pain    HPI Andrew Turner is a 33 y.o. male.    Back Pain Patient with chronic low back pain. Seen by orthopedic in the past and is prescribed Meloxicam and Flexeril for management of pain and acute flare up. Patient without Flexeril and meloxicam until last night.  He reports overall at work back pain has exacerbated as he is walking around and ambulating worsens symptoms.  Experiencing muscle spasms radiating to the mid back.  Has any symptoms of shooting pains into his legs or lower extremity weakness.  No recent injury   Past Medical History:  Diagnosis Date   Anxiety    Depression    Irregular heart rhythm    Pneumonia due to COVID-19 virus     Patient Active Problem List   Diagnosis Date Noted   Thyroid disorder screening 07/06/2022   Chronic left-sided thoracic back pain 05/21/2022   Moderate episode of recurrent major depressive disorder (HCC) 05/03/2020   Severe major depression with psychotic features (HCC) 05/03/2020   Generalized anxiety disorder 05/03/2020    Past Surgical History:  Procedure Laterality Date   KNEE SURGERY Left        Home Medications    Prior to Admission medications   Medication Sig Start Date End Date Taking? Authorizing Provider  cyclobenzaprine (FLEXERIL) 5 MG tablet TAKE 1-2 TABLETS (5-10 MG TOTAL) BY MOUTH AT BEDTIME. 04/28/23   Madelyn Brunner, DO  meloxicam (MOBIC) 15 MG tablet Take 1 tablet (15 mg total) by mouth daily. 01/19/23   Madelyn Brunner, DO  omeprazole (PRILOSEC) 40 MG capsule Take 1 capsule (40 mg total) by mouth daily. 04/22/23   Zenia Resides, MD  ondansetron (ZOFRAN-ODT) 4 MG disintegrating tablet Take 1 tablet (4 mg total) by mouth every 8 (eight) hours as needed for nausea or vomiting. 04/22/23   Zenia Resides, MD    Family History Family History   Problem Relation Age of Onset   Atrial fibrillation Mother     Social History Social History   Tobacco Use   Smoking status: Former   Smokeless tobacco: Never  Vaping Use   Vaping status: Never Used  Substance Use Topics   Alcohol use: Not Currently   Drug use: Not Currently     Allergies   Patient has no known allergies.   Review of Systems Review of Systems  Musculoskeletal:  Positive for back pain.     Physical Exam Triage Vital Signs ED Triage Vitals  Encounter Vitals Group     BP 04/29/23 1901 122/82     Systolic BP Percentile --      Diastolic BP Percentile --      Pulse Rate 04/29/23 1901 95     Resp --      Temp 04/29/23 1901 (!) 97.5 F (36.4 C)     Temp Source 04/29/23 1901 Oral     SpO2 04/29/23 1901 96 %     Weight 04/29/23 1905 185 lb (83.9 kg)     Height 04/29/23 1905 5\' 8"  (1.727 m)     Head Circumference --      Peak Flow --      Pain Score 04/29/23 1904 8     Pain Loc --      Pain Education --  Exclude from Growth Chart --    No data found.  Updated Vital Signs BP 122/82 (BP Location: Left Arm)   Pulse 95   Temp (!) 97.5 F (36.4 C) (Oral)   Ht 5\' 8"  (1.727 m)   Wt 185 lb (83.9 kg)   SpO2 96%   BMI 28.13 kg/m   Visual Acuity Right Eye Distance:   Left Eye Distance:   Bilateral Distance:    Right Eye Near:   Left Eye Near:    Bilateral Near:     Physical Exam Vitals reviewed.  Constitutional:      Appearance: Normal appearance.  HENT:     Head: Normocephalic and atraumatic.  Cardiovascular:     Rate and Rhythm: Normal rate.  Pulmonary:     Effort: Pulmonary effort is normal.     Breath sounds: Normal breath sounds.  Musculoskeletal:       Back:  Neurological:     Mental Status: He is alert.      UC Treatments / Results  Labs (all labs ordered are listed, but only abnormal results are displayed) Labs Reviewed - No data to display  EKG   Radiology No results found.  Procedures Procedures  (including critical care time)  Medications Ordered in UC Medications  ketorolac (TORADOL) 30 MG/ML injection 30 mg (has no administration in time range)  dexamethasone (DECADRON) injection 10 mg (10 mg Intramuscular Given 04/29/23 1935)    Initial Impression / Assessment and Plan / UC Course  I have reviewed the triage vital signs and the nursing notes.  Pertinent labs & imaging results that were available during my care of the patient were reviewed by me and considered in my medical decision making (see chart for details).    Acute episode of chronic low back pain without sciatica with muscle spasms.  Patient received Toradol IM 30 mg here in clinic.  He has not taken meloxicam today.  Decadron 10 mg IM.  Patient will continue home management with relaxers and can resume meloxicam tomorrow night.  Will follow-up with primary care provider or orthopedic if symptoms worsen or do not improve.  Work note provided. Final Clinical Impressions(s) / UC Diagnoses   Final diagnoses:  Chronic bilateral low back pain without sciatica  Muscle spasm of back     Discharge Instructions      Take your next dose of meloxicam tomorrow night.  You may take your muscle relaxer nightly as prescribed.  Follow-up with your primary care doctor or orthopedic specialist symptoms worsen or do not improve.     ED Prescriptions   None    PDMP not reviewed this encounter.   Bing Neighbors, NP 04/29/23 [phone]

## 2023-05-25 ENCOUNTER — Ambulatory Visit: Payer: MEDICAID

## 2023-05-31 ENCOUNTER — Ambulatory Visit
Admission: EM | Admit: 2023-05-31 | Discharge: 2023-05-31 | Disposition: A | Discharge status: Home / Self Care | Payer: MEDICAID | Attending: Internal Medicine | Admitting: Internal Medicine

## 2023-05-31 ENCOUNTER — Ambulatory Visit (INDEPENDENT_AMBULATORY_CARE_PROVIDER_SITE_OTHER): Payer: MEDICAID

## 2023-05-31 ENCOUNTER — Encounter: Payer: Self-pay | Admitting: *Deleted

## 2023-05-31 ENCOUNTER — Other Ambulatory Visit: Payer: Self-pay

## 2023-05-31 ENCOUNTER — Ambulatory Visit: Payer: MEDICAID

## 2023-05-31 DIAGNOSIS — M1712 Unilateral primary osteoarthritis, left knee: Secondary | ICD-10-CM

## 2023-05-31 DIAGNOSIS — G8929 Other chronic pain: Secondary | ICD-10-CM

## 2023-05-31 DIAGNOSIS — M25562 Pain in left knee: Secondary | ICD-10-CM | POA: Diagnosis not present

## 2023-05-31 DIAGNOSIS — M25572 Pain in left ankle and joints of left foot: Secondary | ICD-10-CM

## 2023-05-31 MED ORDER — MELOXICAM 15 MG PO TABS: 15.0000 mg | ORAL_TABLET | Freq: Every day | ORAL | 0 refills | Status: AC

## 2023-05-31 NOTE — Discharge Instructions (Signed)
 I will call if x-rays are abnormal.  Apply ice.  Follow-up with orthopedist.  Your meloxicam has been refilled.  Please follow-up with primary care doctor.

## 2023-05-31 NOTE — ED Provider Notes (Signed)
 [facility]    CSN: [id number] Arrival date & time: 05/31/23  1140      History   Chief Complaint Chief Complaint  Patient presents with   Knee Pain    HPI Andrew Turner is a 33 y.o. male.   Patient presents with left knee and ankle pain.  Patient has a history of ACL tear where he has had 2 previous surgeries and has been told by his orthopedist that he needs another one.  Although, it has not yet been scheduled.  States that he was coming out of the freezer at work when his left knee "buckled" causing him to hit his right knee on the freezer.  He was also in bed trying to get out of bed when his knee buckled again causing him to twist his left ankle over.  He takes meloxicam and muscle relaxer for pain but is currently out of those medications.  Is having difficulty bearing weight.  He has no concern for right knee pain/injury.  He wants left ankle and left knee evaluated. He does not want right knee evaluated.    Knee Pain   Past Medical History:  Diagnosis Date   Anxiety    Depression    Irregular heart rhythm    Pneumonia due to COVID-19 virus     Patient Active Problem List   Diagnosis Date Noted   Thyroid disorder screening 07/06/2022   Chronic left-sided thoracic back pain 05/21/2022   Moderate episode of recurrent major depressive disorder (HCC) 05/03/2020   Severe major depression with psychotic features (HCC) 05/03/2020   Generalized anxiety disorder 05/03/2020    Past Surgical History:  Procedure Laterality Date   KNEE SURGERY Left        Home Medications    Prior to Admission medications   Medication Sig Start Date End Date Taking? Authorizing Provider  cyclobenzaprine (FLEXERIL) 5 MG tablet TAKE 1-2 TABLETS (5-10 MG TOTAL) BY MOUTH AT BEDTIME. 04/28/23  Yes Madelyn Brunner, DO  meloxicam (MOBIC) 15 MG tablet Take 1 tablet (15 mg total) by mouth daily. 05/31/23   Gustavus Bryant, FNP  omeprazole (PRILOSEC) 40 MG capsule Take 1 capsule (40 mg  total) by mouth daily. 04/22/23   Zenia Resides, MD  ondansetron (ZOFRAN-ODT) 4 MG disintegrating tablet Take 1 tablet (4 mg total) by mouth every 8 (eight) hours as needed for nausea or vomiting. 04/22/23   Zenia Resides, MD    Family History Family History  Problem Relation Age of Onset   Atrial fibrillation Mother     Social History Social History   Tobacco Use   Smoking status: Former   Smokeless tobacco: Never  Vaping Use   Vaping status: Never Used  Substance Use Topics   Alcohol use: Not Currently   Drug use: Not Currently     Allergies   Patient has no known allergies.   Review of Systems Review of Systems Per HPI  Physical Exam Triage Vital Signs ED Triage Vitals  Encounter Vitals Group     BP 05/31/23 1212 127/89     Systolic BP Percentile --      Diastolic BP Percentile --      Pulse Rate 05/31/23 1212 85     Resp 05/31/23 1212 16     Temp 05/31/23 1212 97.9 F (36.6 C)     Temp Source 05/31/23 1212 Oral     SpO2 05/31/23 1212 97 %     Weight --  Height --      Head Circumference --      Peak Flow --      Pain Score 05/31/23 1209 8     Pain Loc --      Pain Education --      Exclude from Growth Chart --    No data found.  Updated Vital Signs BP 127/89 (BP Location: Left Arm)   Pulse 85   Temp 97.9 F (36.6 C) (Oral)   Resp 16   SpO2 97%   Visual Acuity Right Eye Distance:   Left Eye Distance:   Bilateral Distance:    Right Eye Near:   Left Eye Near:    Bilateral Near:     Physical Exam Constitutional:      General: He is not in acute distress.    Appearance: Normal appearance. He is not toxic-appearing or diaphoretic.  HENT:     Head: Normocephalic and atraumatic.  Eyes:     Extraocular Movements: Extraocular movements intact.     Conjunctiva/sclera: Conjunctivae normal.  Pulmonary:     Effort: Pulmonary effort is normal.  Musculoskeletal:     Left knee: Swelling present. Tenderness present. No LCL laxity, MCL  laxity, ACL laxity or PCL laxity.Normal alignment, normal meniscus and normal patellar mobility. Normal pulse.     Comments: Patient has tenderness to palpation throughout left knee.  Mild swelling noted.  No discoloration.  Patient can wiggle toes and bear weight.  Capillary refill and pulses intact.  Tenderness to palpation to lateral, medial, anterior left ankle.  Mild swelling noted.  No discoloration noted.  No tenderness to foot or toes.  No abrasions or lacerations noted.  Neurological:     General: No focal deficit present.     Mental Status: He is alert and oriented to person, place, and time. Mental status is at baseline.  Psychiatric:        Mood and Affect: Mood normal.        Behavior: Behavior normal.        Thought Content: Thought content normal.        Judgment: Judgment normal.      UC Treatments / Results  Labs (all labs ordered are listed, but only abnormal results are displayed) Labs Reviewed - No data to display  EKG   Radiology DG Ankle Complete Left Result Date: 05/31/2023 CLINICAL DATA:  Ankle pain EXAM: LEFT ANKLE COMPLETE - 3+ VIEW COMPARISON:  None Available. FINDINGS: Reduce sensitivity of the lateral projection due to obliquity. Small plantar and Achilles calcaneal spurs. No visible fracture or acute bony findings. Plafond and talar dome appear grossly intact. IMPRESSION: 1. Small plantar and Achilles calcaneal spurs. 2. No acute findings. Electronically Signed   By: Gaylyn Rong M.D.   On: 05/31/2023 14:59   DG Knee Complete 4 Views Left Result Date: 05/31/2023 CLINICAL DATA:  Knee pain, prior knee surgeries. EXAM: LEFT KNEE - COMPLETE 4+ VIEW COMPARISON:  MRI 04/30/2022 FINDINGS: Medial and lateral compartmental spurring. ACL tunnel noted. No fracture or acute bony findings. Small knee effusion appears increased from 02/28/2023. Slight posterior positioning of the femur on the tibia suggesting ACL graft insufficiency. No acute fracture identified.  IMPRESSION: 1. Small knee effusion, increased from 02/28/2023. 2. Slight posterior positioning of the femur on the tibia suggesting ACL graft insufficiency. 3. Medial and lateral compartmental spurring. Electronically Signed   By: Gaylyn Rong M.D.   On: 05/31/2023 14:58    Procedures Procedures (including critical care time)  Medications Ordered in UC Medications - No data to display  Initial Impression / Assessment and Plan / UC Course  I have reviewed the triage vital signs and the nursing notes.  Pertinent labs & imaging results that were available during my care of the patient were reviewed by me and considered in my medical decision making (see chart for details).     Left ankle x-ray is negative for any acute bony abnormality.  Suspect ankle sprain.  Ace wrap applied by clinical staff prior to discharge.  Advised RICE.  X-ray of left knee is notable for increase in joint effusion from previous x-ray as well as concern for ACL graft insufficiency.  Called patient to discuss x-ray results.  Recommended the patient return for knee immobilizer but he declined this at this time.  He states that he does have a regular knee brace at home that he will utilize.  He called orthopedist and has an appointment tomorrow for evaluation.  Advised ice application in the meantime.  Will refill patient's meloxicam.  Encouraged PCP follow-up as kidney function needs to be monitored with this medication given he takes it chronically.  Most recent kidney function is normal so this is safe today. Advised strict follow-up precautions.  Patient verbalized understanding and was agreeable with plan. Final Clinical Impressions(s) / UC Diagnoses   Final diagnoses:  Acute pain of left knee  Acute left ankle pain     Discharge Instructions      I will call if x-rays are abnormal.  Apply ice.  Follow-up with orthopedist.  Your meloxicam has been refilled.  Please follow-up with primary care  doctor.     ED Prescriptions     Medication Sig Dispense Auth. Provider   meloxicam (MOBIC) 15 MG tablet Take 1 tablet (15 mg total) by mouth daily. 30 tablet [place], [place]ie [place], [place]      PDMP not reviewed this encounter.   Gustavus Bryant, [place] 05/31/23 1537

## 2023-05-31 NOTE — ED Triage Notes (Signed)
 Pt reports issues with left knee- states he has "had 2 surgeries already and needs to have another". He hit his right knee at work last Tuesday and left knee buckled and he has had pain in Left knee since then. He states he twisted left ankle when he got out of bed this morning b/c left knee buckled but states "it's not that bad-I'm more worried about the knee". He is followed by [facility]

## 2023-06-01 ENCOUNTER — Encounter (HOSPITAL_BASED_OUTPATIENT_CLINIC_OR_DEPARTMENT_OTHER): Payer: Self-pay | Admitting: Student

## 2023-06-01 ENCOUNTER — Ambulatory Visit (HOSPITAL_BASED_OUTPATIENT_CLINIC_OR_DEPARTMENT_OTHER): Payer: MEDICAID | Admitting: Student

## 2023-06-01 DIAGNOSIS — M25562 Pain in left knee: Secondary | ICD-10-CM

## 2023-06-01 DIAGNOSIS — S83512S Sprain of anterior cruciate ligament of left knee, sequela: Secondary | ICD-10-CM | POA: Diagnosis not present

## 2023-06-01 NOTE — Progress Notes (Signed)
 Chief Complaint: Left knee pain     History of Present Illness:   06/01/23: Patient presents today for follow-up of his left knee.  He does have a known recent history of a complete ACL tear and was recommended for surgery however has been unable to proceed with this due to financial constraints.  Today patient reports that he has had multiple episodes of his left knee buckling and as a result pain and swelling have increased.  He has been working a new job 5 to 6 days a week which involves standing for 8 hours a day.  Has been taking meloxicam and wearing a knee brace.  Patient is moving to [place], [place] at the end of the month.   02/11/23: Andrew Turner is a 33 y.o. male presents today for further evaluation of his left knee.  He has an extensive history of this knee dating back to 2007 when he sustain and ACL, MCL, and meniscus tear due to a football injury.  He had this repaired in 2011 followed by a retear of the ACL and a second subsequent surgery.  Patient reports that in May 2023 he had an MRI with and worked with [facility] which revealed disruption of the ACL, osteoarthritis, and a partial meniscus tear.  Current pain in the left knee has been ongoing since last May.  Does report occasional buckling.  Pain is constant and moderate in severity.  Did receive an intra-articular Toradol injection with Dr. Shon Baton on 11/19 which unfortunately did not provide any significant relief.   Surgical History:   Left ACL reconstruction x 2- 2011  PMH/PSH/Family History/Social History/Meds/Allergies:    Past Medical History:  Diagnosis Date   Anxiety    Depression    Irregular heart rhythm    Pneumonia due to COVID-19 virus    Past Surgical History:  Procedure Laterality Date   KNEE SURGERY Left    Social History   Socioeconomic History   Marital status: Single    Spouse name: Not on file   Number of children: Not on file   Years of education: Not on file    Highest education level: Some college, no degree  Occupational History   Not on file  Tobacco Use   Smoking status: Former   Smokeless tobacco: Never  Vaping Use   Vaping status: Never Used  Substance and Sexual Activity   Alcohol use: Not Currently   Drug use: Not Currently   Sexual activity: Yes  Other Topics Concern   Not on file  Social History Narrative   Not on file   Social Drivers of Health   Financial Resource Strain: Patient Declined (07/05/2022)   Overall Financial Resource Strain (CARDIA)    Difficulty of Paying Living Expenses: Patient declined  Food Insecurity: Patient Declined (07/05/2022)   Hunger Vital Sign    Worried About Running Out of Food in the Last Year: Patient declined    Ran Out of Food in the Last Year: Patient declined  Transportation Needs: Unknown (07/05/2022)   PRAPARE - Transportation    Lack of Transportation (Medical): No    Lack of Transportation (Non-Medical): Patient declined  Physical Activity: Unknown (07/05/2022)   Exercise Vital Sign    Days of Exercise per Week: 0 days    Minutes of Exercise per Session: Not on  file  Stress: Stress Concern Present (07/05/2022)   [employer] of Occupational Health - Occupational Stress Questionnaire    Feeling of Stress : Very much  Social Connections: Socially Isolated (07/05/2022)   Social Connection and Isolation Panel [NHANES]    Frequency of Communication with Friends and Family: Once a week    Frequency of Social Gatherings with Friends and Family: Once a week    Attends Religious Services: Never    Database administrator or Organizations: No    Attends Engineer, structural: Not on file    Marital Status: Living with partner   Family History  Problem Relation Age of Onset   Atrial fibrillation Mother    No Known Allergies Current Outpatient Medications  Medication Sig Dispense Refill   cyclobenzaprine (FLEXERIL) 5 MG tablet TAKE 1-2 TABLETS (5-10 MG TOTAL) BY MOUTH AT BEDTIME.  60 tablet 1   meloxicam (MOBIC) 15 MG tablet Take 1 tablet (15 mg total) by mouth daily. 30 tablet 0   omeprazole (PRILOSEC) 40 MG capsule Take 1 capsule (40 mg total) by mouth daily. 15 capsule 0   ondansetron (ZOFRAN-ODT) 4 MG disintegrating tablet Take 1 tablet (4 mg total) by mouth every 8 (eight) hours as needed for nausea or vomiting. 10 tablet 0   No current facility-administered medications for this visit.   DG Ankle Complete Left Result Date: 05/31/2023 CLINICAL DATA:  Ankle pain EXAM: LEFT ANKLE COMPLETE - 3+ VIEW COMPARISON:  None Available. FINDINGS: Reduce sensitivity of the lateral projection due to obliquity. Small plantar and Achilles calcaneal spurs. No visible fracture or acute bony findings. Plafond and talar dome appear grossly intact. IMPRESSION: 1. Small plantar and Achilles calcaneal spurs. 2. No acute findings. Electronically Signed   By: Gaylyn Rong M.D.   On: 05/31/2023 14:59   DG Knee Complete 4 Views Left Result Date: 05/31/2023 CLINICAL DATA:  Knee pain, prior knee surgeries. EXAM: LEFT KNEE - COMPLETE 4+ VIEW COMPARISON:  MRI 04/30/2022 FINDINGS: Medial and lateral compartmental spurring. ACL tunnel noted. No fracture or acute bony findings. Small knee effusion appears increased from 02/28/2023. Slight posterior positioning of the femur on the tibia suggesting ACL graft insufficiency. No acute fracture identified. IMPRESSION: 1. Small knee effusion, increased from 02/28/2023. 2. Slight posterior positioning of the femur on the tibia suggesting ACL graft insufficiency. 3. Medial and lateral compartmental spurring. Electronically Signed   By: Gaylyn Rong M.D.   On: 05/31/2023 14:58    Review of Systems:   A ROS was performed including pertinent positives and negatives as documented in the HPI.  Physical Exam :   Constitutional: NAD and appears stated age Neurological: Alert and oriented Psych: Appropriate affect and cooperative There were no vitals taken  for this visit.   Comprehensive Musculoskeletal Exam:    Exam of the left knee demonstrates tenderness palpation over the medial joint line.  Active range of motion from 0 to 110 degrees.  Minimal to mild effusion present.  No overlying erythema or warmth.  Positive Lachman and anterior drawer.  Imaging:     Assessment:   33 y.o. 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  Dr. Steward Drone last saw patient in January and at that time recommended ACL reconstruction with quad tendon autograft as well as medial and lateral meniscal repair.  He continues to have instability and buckling episodes although he is now having to move at the end of the month to [place] so  we will likely pursue surgery once he is established there.  I have recommended obtaining a copy of his recent MRI to take.  In terms of current episode, I will write him out for another day work to allow rest.  Also recommended continued RICE therapy and meloxicam as well as knee bracing while weightbearing in order to help prevent buckling episodes.  He will let us know if there is anything else he needs before the move.  Plan :    - Obtain MRI of the left knee and return to clinic for treatment discussion with Dr. Steward Drone     I personally saw and evaluated the patient, and participated in the management and treatment plan.  Hazle Nordmann, PA-C Orthopedics

## 2023-06-08 ENCOUNTER — Encounter: Payer: MEDICAID | Admitting: Family

## 2023-06-08 NOTE — Progress Notes (Signed)
 Erroneous encounter-disregard

## 2023-08-30 ENCOUNTER — Encounter: Payer: MEDICAID | Admitting: Family

## 2024-01-03 ENCOUNTER — Encounter: Payer: Self-pay | Admitting: Radiology
